# Patient Record
Sex: Female | Born: 1940 | Race: White | Hispanic: No | Smoking: Never smoker
Health system: Southern US, Community
[De-identification: ages and names within clinical notes are randomized; demographics above are authoritative.]

## PROBLEM LIST (undated history)

## (undated) DIAGNOSIS — I10 Essential (primary) hypertension: Secondary | ICD-10-CM

## (undated) DIAGNOSIS — G43909 Migraine, unspecified, not intractable, without status migrainosus: Secondary | ICD-10-CM

## (undated) DIAGNOSIS — M353 Polymyalgia rheumatica: Secondary | ICD-10-CM

## (undated) HISTORY — DX: Migraine, unspecified, not intractable, without status migrainosus: G43.909

## (undated) HISTORY — PX: ABDOMINAL HYSTERECTOMY: SHX81

## (undated) HISTORY — DX: Polymyalgia rheumatica: M35.3

---

## 1999-10-03 ENCOUNTER — Encounter: Payer: Self-pay | Admitting: *Deleted

## 1999-10-03 ENCOUNTER — Encounter: Admission: RE | Admit: 1999-10-03 | Discharge: 1999-10-03 | Payer: Self-pay | Admitting: *Deleted

## 2000-10-05 ENCOUNTER — Encounter: Payer: Self-pay | Admitting: *Deleted

## 2000-10-05 ENCOUNTER — Encounter: Admission: RE | Admit: 2000-10-05 | Discharge: 2000-10-05 | Payer: Self-pay | Admitting: *Deleted

## 2001-10-06 ENCOUNTER — Encounter: Payer: Self-pay | Admitting: Internal Medicine

## 2001-10-06 ENCOUNTER — Encounter: Admission: RE | Admit: 2001-10-06 | Discharge: 2001-10-06 | Payer: Self-pay | Admitting: Internal Medicine

## 2003-06-14 ENCOUNTER — Ambulatory Visit (HOSPITAL_COMMUNITY): Admission: RE | Admit: 2003-06-14 | Discharge: 2003-06-14 | Payer: Self-pay | Admitting: Gastroenterology

## 2011-11-05 DIAGNOSIS — R51 Headache: Secondary | ICD-10-CM | POA: Diagnosis not present

## 2011-11-05 DIAGNOSIS — M353 Polymyalgia rheumatica: Secondary | ICD-10-CM | POA: Diagnosis not present

## 2011-11-05 DIAGNOSIS — M1990 Unspecified osteoarthritis, unspecified site: Secondary | ICD-10-CM | POA: Diagnosis not present

## 2011-11-05 DIAGNOSIS — Z79899 Other long term (current) drug therapy: Secondary | ICD-10-CM | POA: Diagnosis not present

## 2011-11-05 DIAGNOSIS — M899 Disorder of bone, unspecified: Secondary | ICD-10-CM | POA: Diagnosis not present

## 2011-11-05 DIAGNOSIS — M2550 Pain in unspecified joint: Secondary | ICD-10-CM | POA: Diagnosis not present

## 2011-11-05 DIAGNOSIS — G43909 Migraine, unspecified, not intractable, without status migrainosus: Secondary | ICD-10-CM | POA: Diagnosis not present

## 2011-11-27 DIAGNOSIS — R51 Headache: Secondary | ICD-10-CM | POA: Diagnosis not present

## 2011-12-14 DIAGNOSIS — H539 Unspecified visual disturbance: Secondary | ICD-10-CM | POA: Diagnosis not present

## 2011-12-16 DIAGNOSIS — B309 Viral conjunctivitis, unspecified: Secondary | ICD-10-CM | POA: Diagnosis not present

## 2011-12-24 DIAGNOSIS — H04129 Dry eye syndrome of unspecified lacrimal gland: Secondary | ICD-10-CM | POA: Diagnosis not present

## 2011-12-24 DIAGNOSIS — H26109 Unspecified traumatic cataract, unspecified eye: Secondary | ICD-10-CM | POA: Diagnosis not present

## 2011-12-24 DIAGNOSIS — B309 Viral conjunctivitis, unspecified: Secondary | ICD-10-CM | POA: Diagnosis not present

## 2011-12-25 DIAGNOSIS — G43909 Migraine, unspecified, not intractable, without status migrainosus: Secondary | ICD-10-CM | POA: Diagnosis not present

## 2012-02-04 DIAGNOSIS — M899 Disorder of bone, unspecified: Secondary | ICD-10-CM | POA: Diagnosis not present

## 2012-02-04 DIAGNOSIS — IMO0002 Reserved for concepts with insufficient information to code with codable children: Secondary | ICD-10-CM | POA: Diagnosis not present

## 2012-02-04 DIAGNOSIS — G43909 Migraine, unspecified, not intractable, without status migrainosus: Secondary | ICD-10-CM | POA: Diagnosis not present

## 2012-02-04 DIAGNOSIS — M949 Disorder of cartilage, unspecified: Secondary | ICD-10-CM | POA: Diagnosis not present

## 2012-02-04 DIAGNOSIS — M353 Polymyalgia rheumatica: Secondary | ICD-10-CM | POA: Diagnosis not present

## 2012-02-04 DIAGNOSIS — M2550 Pain in unspecified joint: Secondary | ICD-10-CM | POA: Diagnosis not present

## 2012-02-04 DIAGNOSIS — M1990 Unspecified osteoarthritis, unspecified site: Secondary | ICD-10-CM | POA: Diagnosis not present

## 2012-05-05 DIAGNOSIS — G43909 Migraine, unspecified, not intractable, without status migrainosus: Secondary | ICD-10-CM | POA: Diagnosis not present

## 2012-05-05 DIAGNOSIS — M1990 Unspecified osteoarthritis, unspecified site: Secondary | ICD-10-CM | POA: Diagnosis not present

## 2012-05-05 DIAGNOSIS — Z79899 Other long term (current) drug therapy: Secondary | ICD-10-CM | POA: Diagnosis not present

## 2012-05-05 DIAGNOSIS — M545 Low back pain: Secondary | ICD-10-CM | POA: Diagnosis not present

## 2012-05-05 DIAGNOSIS — M353 Polymyalgia rheumatica: Secondary | ICD-10-CM | POA: Diagnosis not present

## 2012-05-05 DIAGNOSIS — M2550 Pain in unspecified joint: Secondary | ICD-10-CM | POA: Diagnosis not present

## 2012-06-13 DIAGNOSIS — Z79899 Other long term (current) drug therapy: Secondary | ICD-10-CM | POA: Diagnosis not present

## 2012-06-13 DIAGNOSIS — K219 Gastro-esophageal reflux disease without esophagitis: Secondary | ICD-10-CM | POA: Diagnosis not present

## 2012-06-13 DIAGNOSIS — Z0000 Encounter for general adult medical examination without abnormal findings: Secondary | ICD-10-CM | POA: Diagnosis not present

## 2012-06-13 DIAGNOSIS — E7800 Pure hypercholesterolemia, unspecified: Secondary | ICD-10-CM | POA: Diagnosis not present

## 2012-06-13 DIAGNOSIS — Z1331 Encounter for screening for depression: Secondary | ICD-10-CM | POA: Diagnosis not present

## 2012-08-05 DIAGNOSIS — M899 Disorder of bone, unspecified: Secondary | ICD-10-CM | POA: Diagnosis not present

## 2012-08-05 DIAGNOSIS — M1990 Unspecified osteoarthritis, unspecified site: Secondary | ICD-10-CM | POA: Diagnosis not present

## 2012-08-05 DIAGNOSIS — M2550 Pain in unspecified joint: Secondary | ICD-10-CM | POA: Diagnosis not present

## 2012-08-05 DIAGNOSIS — Z23 Encounter for immunization: Secondary | ICD-10-CM | POA: Diagnosis not present

## 2012-08-05 DIAGNOSIS — M353 Polymyalgia rheumatica: Secondary | ICD-10-CM | POA: Diagnosis not present

## 2012-08-05 DIAGNOSIS — Z79899 Other long term (current) drug therapy: Secondary | ICD-10-CM | POA: Diagnosis not present

## 2012-08-05 DIAGNOSIS — G43909 Migraine, unspecified, not intractable, without status migrainosus: Secondary | ICD-10-CM | POA: Diagnosis not present

## 2012-11-04 DIAGNOSIS — M949 Disorder of cartilage, unspecified: Secondary | ICD-10-CM | POA: Diagnosis not present

## 2012-11-04 DIAGNOSIS — M1990 Unspecified osteoarthritis, unspecified site: Secondary | ICD-10-CM | POA: Diagnosis not present

## 2012-11-04 DIAGNOSIS — G43909 Migraine, unspecified, not intractable, without status migrainosus: Secondary | ICD-10-CM | POA: Diagnosis not present

## 2012-11-04 DIAGNOSIS — K219 Gastro-esophageal reflux disease without esophagitis: Secondary | ICD-10-CM | POA: Diagnosis not present

## 2012-11-04 DIAGNOSIS — M353 Polymyalgia rheumatica: Secondary | ICD-10-CM | POA: Diagnosis not present

## 2012-11-04 DIAGNOSIS — M899 Disorder of bone, unspecified: Secondary | ICD-10-CM | POA: Diagnosis not present

## 2012-11-04 DIAGNOSIS — IMO0002 Reserved for concepts with insufficient information to code with codable children: Secondary | ICD-10-CM | POA: Diagnosis not present

## 2012-11-04 DIAGNOSIS — M2550 Pain in unspecified joint: Secondary | ICD-10-CM | POA: Diagnosis not present

## 2013-03-03 DIAGNOSIS — M353 Polymyalgia rheumatica: Secondary | ICD-10-CM | POA: Diagnosis not present

## 2013-03-03 DIAGNOSIS — M2550 Pain in unspecified joint: Secondary | ICD-10-CM | POA: Diagnosis not present

## 2013-03-03 DIAGNOSIS — M1990 Unspecified osteoarthritis, unspecified site: Secondary | ICD-10-CM | POA: Diagnosis not present

## 2013-03-03 DIAGNOSIS — IMO0002 Reserved for concepts with insufficient information to code with codable children: Secondary | ICD-10-CM | POA: Diagnosis not present

## 2013-06-02 DIAGNOSIS — M2550 Pain in unspecified joint: Secondary | ICD-10-CM | POA: Diagnosis not present

## 2013-06-02 DIAGNOSIS — M1990 Unspecified osteoarthritis, unspecified site: Secondary | ICD-10-CM | POA: Diagnosis not present

## 2013-06-02 DIAGNOSIS — IMO0002 Reserved for concepts with insufficient information to code with codable children: Secondary | ICD-10-CM | POA: Diagnosis not present

## 2013-06-02 DIAGNOSIS — M353 Polymyalgia rheumatica: Secondary | ICD-10-CM | POA: Diagnosis not present

## 2013-06-15 DIAGNOSIS — Z1331 Encounter for screening for depression: Secondary | ICD-10-CM | POA: Diagnosis not present

## 2013-06-15 DIAGNOSIS — Z86010 Personal history of colonic polyps: Secondary | ICD-10-CM | POA: Diagnosis not present

## 2013-06-15 DIAGNOSIS — M353 Polymyalgia rheumatica: Secondary | ICD-10-CM | POA: Diagnosis not present

## 2013-06-15 DIAGNOSIS — M899 Disorder of bone, unspecified: Secondary | ICD-10-CM | POA: Diagnosis not present

## 2013-06-15 DIAGNOSIS — E7800 Pure hypercholesterolemia, unspecified: Secondary | ICD-10-CM | POA: Diagnosis not present

## 2013-06-15 DIAGNOSIS — Z0000 Encounter for general adult medical examination without abnormal findings: Secondary | ICD-10-CM | POA: Diagnosis not present

## 2013-06-15 DIAGNOSIS — Z79899 Other long term (current) drug therapy: Secondary | ICD-10-CM | POA: Diagnosis not present

## 2013-06-15 DIAGNOSIS — K219 Gastro-esophageal reflux disease without esophagitis: Secondary | ICD-10-CM | POA: Diagnosis not present

## 2013-07-03 DIAGNOSIS — Z1231 Encounter for screening mammogram for malignant neoplasm of breast: Secondary | ICD-10-CM | POA: Diagnosis not present

## 2013-07-12 DIAGNOSIS — M899 Disorder of bone, unspecified: Secondary | ICD-10-CM | POA: Diagnosis not present

## 2013-07-18 DIAGNOSIS — Z23 Encounter for immunization: Secondary | ICD-10-CM | POA: Diagnosis not present

## 2013-08-30 DIAGNOSIS — M1990 Unspecified osteoarthritis, unspecified site: Secondary | ICD-10-CM | POA: Diagnosis not present

## 2013-08-30 DIAGNOSIS — M2550 Pain in unspecified joint: Secondary | ICD-10-CM | POA: Diagnosis not present

## 2013-08-30 DIAGNOSIS — IMO0002 Reserved for concepts with insufficient information to code with codable children: Secondary | ICD-10-CM | POA: Diagnosis not present

## 2013-08-30 DIAGNOSIS — M353 Polymyalgia rheumatica: Secondary | ICD-10-CM | POA: Diagnosis not present

## 2013-11-24 DIAGNOSIS — D485 Neoplasm of uncertain behavior of skin: Secondary | ICD-10-CM | POA: Diagnosis not present

## 2013-11-24 DIAGNOSIS — L570 Actinic keratosis: Secondary | ICD-10-CM | POA: Diagnosis not present

## 2014-01-05 DIAGNOSIS — L570 Actinic keratosis: Secondary | ICD-10-CM | POA: Diagnosis not present

## 2014-03-08 DIAGNOSIS — M353 Polymyalgia rheumatica: Secondary | ICD-10-CM | POA: Diagnosis not present

## 2014-03-08 DIAGNOSIS — M1990 Unspecified osteoarthritis, unspecified site: Secondary | ICD-10-CM | POA: Diagnosis not present

## 2014-03-08 DIAGNOSIS — M2550 Pain in unspecified joint: Secondary | ICD-10-CM | POA: Diagnosis not present

## 2014-03-08 DIAGNOSIS — IMO0002 Reserved for concepts with insufficient information to code with codable children: Secondary | ICD-10-CM | POA: Diagnosis not present

## 2014-03-20 DIAGNOSIS — R42 Dizziness and giddiness: Secondary | ICD-10-CM | POA: Diagnosis not present

## 2014-03-27 ENCOUNTER — Encounter: Payer: Self-pay | Admitting: Neurology

## 2014-03-27 ENCOUNTER — Ambulatory Visit (INDEPENDENT_AMBULATORY_CARE_PROVIDER_SITE_OTHER): Payer: Medicare Other | Admitting: Neurology

## 2014-03-27 VITALS — BP 130/78 | HR 64 | Ht 62.21 in | Wt 136.2 lb

## 2014-03-27 DIAGNOSIS — M353 Polymyalgia rheumatica: Secondary | ICD-10-CM

## 2014-03-27 DIAGNOSIS — R42 Dizziness and giddiness: Secondary | ICD-10-CM

## 2014-03-27 DIAGNOSIS — G43909 Migraine, unspecified, not intractable, without status migrainosus: Secondary | ICD-10-CM

## 2014-03-27 NOTE — Patient Instructions (Signed)
1. We will obtain bloodwork done by your PCP and if additional labs are needed, we will contact you 2. Minimize Tylenol or other over the counter medications to 2-3 a week to avoid rebound headaches 3. Keep a headache calendar

## 2014-03-27 NOTE — Progress Notes (Signed)
NEUROLOGY CONSULTATION NOTE  Madeline Graham MRN: [id number] DOB: 27-Jun-1941  Referring provider: Dr. Nehemiah Settle Primary care provider: Dr. Nehemiah Settle  Reason for consult:  Dizziness, increased headaches  Dear Dr Maxwell Caul:  Thank you for your kind referral of Madeline Graham for consultation of the above symptoms. Although her history is well known to you, please allow me to reiterate it for the purpose of our medical record. Records and images were personally reviewed where available.  HISTORY OF PRESENT ILLNESS: This is a pleasant 73 year old right-handed woman with a history of migraines and polymyalgia rheumatica, in her usual state of health until the past 2 months when she started having an increase in migraine frequency.  She has had migraines since age 45, occurring every 1-3 months, however over the past 2 months, headaches have increased to 1-2 a week.  Headaches are similar to her typical migraines, with throbbing pain in the frontal and occipital region with associated photo and phonophobia, no nausea/vomiting. She has visual auras 15-20 minutes prior to her migraines.  She usually goes to bed and eats a few hours later.  She takes arthritis strength Tylenol and Flexeril which helps.  Triggers include neck pain and flashing lights.  She was having dizziness 2 weeks ago, occurring mid-day when walking outside or looking down, with difficulty focusing.  There was no associated nausea/vomiting, she denied headaches during this time.  The dizziness has resolved.  She has occasional numbness in the left thumb and index finger.  She does report having left hand numbness with her migraines in the past.    She has neck, shoulder, and hip pain with the PMR.  She denies any diplopia, dysarthria, dysphagia, bowel/bladder dysfunction.  She has been on a lower dose of prednisone 71m 5-6 weeks ago.   Laboratory Data: None available, according to patient bloodwork done with PCP  recently.  PAST MEDICAL HISTORY: Past Medical History  Diagnosis Date  . Polymyalgia rheumatica   . Migraine     PAST SURGICAL HISTORY: Past Surgical History  Procedure Laterality Date  . Abdominal hysterectomy      MEDICATIONS: Prednisone 4107mdaily   No current facility-administered medications on file prior to visit.    ALLERGIES: Allergies  Allergen Reactions  . Sulfa Antibiotics     FAMILY HISTORY: Family History  Problem Relation Age of Onset  . Heart Problems Father   . Scleroderma Mother   . Heart attack Father   . Heart Problems Mother     SOCIAL HISTORY: History   Social History  . Marital Status: Widowed    Spouse Name: N/A    Number of Children: 2  . Years of Education: N/A   Occupational History  . retired    Social History Main Topics  . Smoking status: Never Smoker   . Smokeless tobacco: Never Used  . Alcohol Use: No  . Drug Use: No  . Sexual Activity: Not on file   Other Topics Concern  . Not on file   Social History Narrative  . No narrative on file    REVIEW OF SYSTEMS: Constitutional: No fevers, chills, or sweats, no generalized fatigue, change in appetite Eyes: No visual changes, double vision, eye pain Ear, nose and throat: No hearing loss, ear pain, nasal congestion, sore throat Cardiovascular: No chest pain, palpitations Respiratory:  No shortness of breath at rest or with exertion, wheezes GastrointestinaI: No nausea, vomiting, diarrhea, abdominal pain, fecal incontinence Genitourinary:  No dysuria, urinary  retention or frequency Musculoskeletal:  + neck pain, back pain Integumentary: No rash, pruritus, skin lesions Neurological: as above Psychiatric: No depression, insomnia, anxiety Endocrine: No palpitations, fatigue, diaphoresis, mood swings, change in appetite, change in weight, increased thirst Hematologic/Lymphatic:  No anemia, purpura, petechiae. Allergic/Immunologic: no itchy/runny eyes, nasal congestion,  recent allergic reactions, rashes  PHYSICAL EXAM: Filed Vitals:   03/27/14 1245  BP: 130/78  Pulse: 64   General: No acute distress Head:  Normocephalic/atraumatic. +Tenderness to palpation over the left temporal region Eyes: Fundoscopic exam shows bilateral sharp discs, no vessel changes, exudates, or hemorrhages Neck: supple, no paraspinal tenderness, full range of motion Back: No paraspinal tenderness Heart: regular rate and rhythm Lungs: Clear to auscultation bilaterally. Vascular: No carotid bruits. Skin/Extremities: No rash, no edema Neurological Exam: Mental status: alert and oriented to person, place, and time, no dysarthria or aphasia, Fund of knowledge is appropriate.  Recent and remote memory are intact.  Attention and concentration are normal.    Able to name objects and repeat phrases. Cranial nerves: CN I: not tested CN II: pupils equal, round and reactive to light, visual fields intact, fundi unremarkable. CN III, IV, VI:  full range of motion, no nystagmus, no ptosis CN V: facial sensation intact CN VII: upper and lower face symmetric CN VIII: hearing intact to finger rub CN IX, X: gag intact, uvula midline CN XI: sternocleidomastoid and trapezius muscles intact CN XII: tongue midline Bulk & Tone: normal, no fasciculations. Motor: 5/5 throughout with no pronator drift. Sensation: intact to light touch, cold, pin, vibration and joint position sense.  No extinction to double simultaneous stimulation.  Romberg test negative Deep Tendon Reflexes: +2 throughout except for +1 bilateral ankle jerks, no ankle clonus Plantar responses: downgoing bilaterally Cerebellar: no incoordination on finger to nose, heel to shin. No dysdiadochokinesia Gait: narrow-based and steady, able to tandem walk adequately. Tremor: none  IMPRESSION: This is a pleasant 73 year old right-handed woman with a history of polymyalgia rheumatica and migraines, presenting for dizziness and increased  migraine frequency.  The dizziness that occurred for 4-5 days 2 weeks ago has resolved, there is a positional component to her description of the dizziness.  We discussed the increase in migraines, there may be a component of medication overuse, she knows to minimize use of Tylenol to 2-3 a week.  Bloodwork done through her PCP will be requested for review, she has some left temporal tenderness and ESR and CRP will be ordered if not done.  We discussed option for headache prophylaxis, however she would like to hold off on this for now and will keep a headache calendar.  She will follow-up in 3 months and knows to call our office for any problems.  Thank you for allowing me to participate in the care of this patient. Please do not hesitate to call for any questions or concerns.   Madeline Graham, M.D.

## 2014-03-29 ENCOUNTER — Encounter: Payer: Self-pay | Admitting: Neurology

## 2014-03-29 DIAGNOSIS — M353 Polymyalgia rheumatica: Secondary | ICD-10-CM | POA: Insufficient documentation

## 2014-06-25 DIAGNOSIS — Z79899 Other long term (current) drug therapy: Secondary | ICD-10-CM | POA: Diagnosis not present

## 2014-06-25 DIAGNOSIS — G43909 Migraine, unspecified, not intractable, without status migrainosus: Secondary | ICD-10-CM | POA: Diagnosis not present

## 2014-06-25 DIAGNOSIS — Z0000 Encounter for general adult medical examination without abnormal findings: Secondary | ICD-10-CM | POA: Diagnosis not present

## 2014-06-25 DIAGNOSIS — Z1331 Encounter for screening for depression: Secondary | ICD-10-CM | POA: Diagnosis not present

## 2014-06-25 DIAGNOSIS — M899 Disorder of bone, unspecified: Secondary | ICD-10-CM | POA: Diagnosis not present

## 2014-06-29 ENCOUNTER — Encounter: Payer: Self-pay | Admitting: Neurology

## 2014-06-29 ENCOUNTER — Ambulatory Visit (INDEPENDENT_AMBULATORY_CARE_PROVIDER_SITE_OTHER): Payer: Medicare Other | Admitting: Neurology

## 2014-06-29 VITALS — BP 136/74 | HR 67 | Temp 98.5°F | Ht 62.0 in | Wt 134.5 lb

## 2014-06-29 DIAGNOSIS — G43819 Other migraine, intractable, without status migrainosus: Secondary | ICD-10-CM

## 2014-06-29 NOTE — Progress Notes (Signed)
NEUROLOGY FOLLOW UP OFFICE NOTE  Madeline Graham [id number]  HISTORY OF PRESENT ILLNESS: I had the pleasure of seeing Madeline Graham in follow-up in the neurology clinic on 06/29/2014.  The patient was last seen 3 months ago for increased migraines.  We had discussed option for starting headache prophylactic medication however she wanted to hold off and continue monitoring symptoms first.  In the last 3 months, she reports that headaches are a lot milder.  She reports going a month headache-free, which is good for for her.  She usually has mild headaches every few weeks now, some of them affecting her vision where she sees "arches" in her vision, followed 20 minutes later by pain in the back of her head and neck. When she has flares of her PMR, this also affects these regions. She tried reducing prednisone by alternating between 3 and 4mg , and found that this seemed to cause more headaches, so now she is back on 4mg  daily. She denies any further episodes of dizziness.  She feels that stopping frequent use of Tylenol has also helped.  HPI:  This is a pleasant 73 yo RH woman with a history of migraines and polymyalgia rheumatica, in her usual state of health until April 2015 when she started having an increase in migraine frequency. She has had migraines since age 58, occurring every 1-3 months, however headaches had increased to 1-2 a week. Headaches are similar to her typical migraines, with throbbing pain in the frontal and occipital region with associated photo and phonophobia, no nausea/vomiting. She has visual auras 15-20 minutes prior to her migraines. She usually goes to bed and eats a few hours later. She takes arthritis strength Tylenol and Flexeril which helps. Triggers include neck pain and flashing lights. She was having dizziness at that time, which had resolved on her initial visit in June.  She reports having left hand numbness with her migraines in the past.   PAST MEDICAL HISTORY: Past  Medical History  Diagnosis Date  . Polymyalgia rheumatica   . Migraine     MEDICATIONS: Current Outpatient Prescriptions on File Prior to Visit  Medication Sig Dispense Refill  . omeprazole (PRILOSEC) 20 MG capsule Take 20 mg by mouth daily.      . predniSONE (DELTASONE) 1 MG tablet Take 4 mg by mouth daily with breakfast.       No current facility-administered medications on file prior to visit.    ALLERGIES: Allergies  Allergen Reactions  . Sulfa Antibiotics     FAMILY HISTORY: Family History  Problem Relation Age of Onset  . Heart Problems Father   . Scleroderma Mother   . Heart attack Father   . Heart Problems Mother     SOCIAL HISTORY: History   Social History  . Marital Status: Widowed    Spouse Name: N/A    Number of Children: 2  . Years of Education: N/A   Occupational History  . retired    Social History Main Topics  . Smoking status: Never Smoker   . Smokeless tobacco: Never Used  . Alcohol Use: No  . Drug Use: No  . Sexual Activity: Not on file   Other Topics Concern  . Not on file   Social History Narrative  . No narrative on file    REVIEW OF SYSTEMS: Constitutional: No fevers, chills, or sweats, no generalized fatigue, change in appetite Eyes: No visual changes, double vision, eye pain Ear, nose and throat: No hearing loss, ear pain,  nasal congestion, sore throat Cardiovascular: No chest pain, palpitations Respiratory:  No shortness of breath at rest or with exertion, wheezes GastrointestinaI: No nausea, vomiting, diarrhea, abdominal pain, fecal incontinence Genitourinary:  No dysuria, urinary retention or frequency Musculoskeletal:  No neck pain, back pain Integumentary: No rash, pruritus, skin lesions Neurological: as above Psychiatric: No depression, insomnia, anxiety Endocrine: No palpitations, fatigue, diaphoresis, mood swings, change in appetite, change in weight, increased thirst Hematologic/Lymphatic:  No anemia, purpura,  petechiae. Allergic/Immunologic: no itchy/runny eyes, nasal congestion, recent allergic reactions, rashes  PHYSICAL EXAM: Filed Vitals:   06/29/14 1313  BP: 136/74  Pulse: 67  Temp: 98.5 F (36.9 C)   General: No acute distress Head:  Normocephalic/atraumatic Neck: supple, no paraspinal tenderness, full range of motion Heart:  Regular rate and rhythm Lungs:  Clear to auscultation bilaterally Back: No paraspinal tenderness Skin/Extremities: No rash, no edema Neurological Exam: alert and oriented to person, place, and time. No aphasia or dysarthria. Fund of knowledge is appropriate.  Recent and remote memory are intact.  Attention and concentration are normal.    Able to name objects and repeat phrases. Cranial nerves: Pupils equal, round, reactive to light.  Fundoscopic exam unremarkable, no papilledema. Extraocular movements intact with no nystagmus. Visual fields full. Facial sensation intact. No facial asymmetry. Tongue, uvula, palate midline.  Motor: Bulk and tone normal, muscle strength 5/5 throughout with no pronator drift.  Sensation to light touch, temperature and vibration intact.  No extinction to double simultaneous stimulation.  Deep tendon reflexes 2+ throughout except for +1 ankle jerks, toes downgoing.  Finger to nose testing intact.  Gait narrow-based and steady, able to tandem walk adequately.  Romberg negative.  IMPRESSION: This is a pleasant 73 yo RH woman with a history of polymyalgia rheumatica and migraines, who presented for increased migraine frequency. She was having dizziness which had resolved and has not recurred.  We had discussed medication overuse headaches, headaches have improved with reduction in Tylenol intake.  She knows to call our office for increase in migraine frequency, at which point headache prophylaxis will be discussed. She will be seeing her eye doctor soon.  She will follow-up in 6 months and knows to call our office for any problems.  Thank you  for allowing me to participate in her care.  Please do not hesitate to call for any questions or concerns.  The duration of this appointment visit was 15 minutes of face-to-face time with the patient.  Greater than 50% of this time was spent in counseling, explanation of diagnosis, planning of further management, and coordination of care.   Ellouise Newer, M.D.   CC: Dr. Maxwell Caul

## 2014-06-29 NOTE — Patient Instructions (Signed)
1. Continue to monitor headache frequency, keep headache calendar 2. Follow-up in 6 months, or call if any problems in the meantime

## 2014-07-09 DIAGNOSIS — C44319 Basal cell carcinoma of skin of other parts of face: Secondary | ICD-10-CM | POA: Diagnosis not present

## 2014-07-09 DIAGNOSIS — L570 Actinic keratosis: Secondary | ICD-10-CM | POA: Diagnosis not present

## 2014-07-16 DIAGNOSIS — Z1231 Encounter for screening mammogram for malignant neoplasm of breast: Secondary | ICD-10-CM | POA: Diagnosis not present

## 2014-07-18 DIAGNOSIS — Z23 Encounter for immunization: Secondary | ICD-10-CM | POA: Diagnosis not present

## 2014-08-20 ENCOUNTER — Other Ambulatory Visit: Payer: Self-pay | Admitting: Physician Assistant

## 2014-08-20 DIAGNOSIS — Z85828 Personal history of other malignant neoplasm of skin: Secondary | ICD-10-CM | POA: Diagnosis not present

## 2014-08-20 DIAGNOSIS — C4441 Basal cell carcinoma of skin of scalp and neck: Secondary | ICD-10-CM | POA: Diagnosis not present

## 2014-08-20 DIAGNOSIS — Z08 Encounter for follow-up examination after completed treatment for malignant neoplasm: Secondary | ICD-10-CM | POA: Diagnosis not present

## 2014-08-30 DIAGNOSIS — C4441 Basal cell carcinoma of skin of scalp and neck: Secondary | ICD-10-CM | POA: Diagnosis not present

## 2014-09-06 DIAGNOSIS — M2550 Pain in unspecified joint: Secondary | ICD-10-CM | POA: Diagnosis not present

## 2014-09-06 DIAGNOSIS — M159 Polyosteoarthritis, unspecified: Secondary | ICD-10-CM | POA: Diagnosis not present

## 2014-09-06 DIAGNOSIS — M353 Polymyalgia rheumatica: Secondary | ICD-10-CM | POA: Diagnosis not present

## 2014-09-06 DIAGNOSIS — M8580 Other specified disorders of bone density and structure, unspecified site: Secondary | ICD-10-CM | POA: Diagnosis not present

## 2014-10-25 DIAGNOSIS — Z08 Encounter for follow-up examination after completed treatment for malignant neoplasm: Secondary | ICD-10-CM | POA: Diagnosis not present

## 2014-10-25 DIAGNOSIS — Z85828 Personal history of other malignant neoplasm of skin: Secondary | ICD-10-CM | POA: Diagnosis not present

## 2014-12-06 ENCOUNTER — Telehealth: Payer: Self-pay | Admitting: *Deleted

## 2014-12-06 NOTE — Telephone Encounter (Signed)
Patient canceled follow up appointment and will call to reschedule at a later date

## 2014-12-28 ENCOUNTER — Ambulatory Visit: Payer: Medicare Other | Admitting: Neurology

## 2015-01-03 DIAGNOSIS — M353 Polymyalgia rheumatica: Secondary | ICD-10-CM | POA: Diagnosis not present

## 2015-01-03 DIAGNOSIS — M150 Primary generalized (osteo)arthritis: Secondary | ICD-10-CM | POA: Diagnosis not present

## 2015-01-03 DIAGNOSIS — Z7952 Long term (current) use of systemic steroids: Secondary | ICD-10-CM | POA: Diagnosis not present

## 2015-01-03 DIAGNOSIS — M2550 Pain in unspecified joint: Secondary | ICD-10-CM | POA: Diagnosis not present

## 2015-01-24 DIAGNOSIS — Z85828 Personal history of other malignant neoplasm of skin: Secondary | ICD-10-CM | POA: Diagnosis not present

## 2015-01-24 DIAGNOSIS — Z08 Encounter for follow-up examination after completed treatment for malignant neoplasm: Secondary | ICD-10-CM | POA: Diagnosis not present

## 2015-01-24 DIAGNOSIS — L814 Other melanin hyperpigmentation: Secondary | ICD-10-CM | POA: Diagnosis not present

## 2015-01-24 DIAGNOSIS — D225 Melanocytic nevi of trunk: Secondary | ICD-10-CM | POA: Diagnosis not present

## 2015-04-04 DIAGNOSIS — M2550 Pain in unspecified joint: Secondary | ICD-10-CM | POA: Diagnosis not present

## 2015-04-04 DIAGNOSIS — M150 Primary generalized (osteo)arthritis: Secondary | ICD-10-CM | POA: Diagnosis not present

## 2015-04-04 DIAGNOSIS — Z7952 Long term (current) use of systemic steroids: Secondary | ICD-10-CM | POA: Diagnosis not present

## 2015-04-04 DIAGNOSIS — M353 Polymyalgia rheumatica: Secondary | ICD-10-CM | POA: Diagnosis not present

## 2015-07-11 DIAGNOSIS — Z0000 Encounter for general adult medical examination without abnormal findings: Secondary | ICD-10-CM | POA: Diagnosis not present

## 2015-07-11 DIAGNOSIS — M353 Polymyalgia rheumatica: Secondary | ICD-10-CM | POA: Diagnosis not present

## 2015-07-11 DIAGNOSIS — E785 Hyperlipidemia, unspecified: Secondary | ICD-10-CM | POA: Diagnosis not present

## 2015-07-11 DIAGNOSIS — Z1389 Encounter for screening for other disorder: Secondary | ICD-10-CM | POA: Diagnosis not present

## 2015-07-11 DIAGNOSIS — G43109 Migraine with aura, not intractable, without status migrainosus: Secondary | ICD-10-CM | POA: Diagnosis not present

## 2015-07-11 DIAGNOSIS — Z1211 Encounter for screening for malignant neoplasm of colon: Secondary | ICD-10-CM | POA: Diagnosis not present

## 2015-07-11 DIAGNOSIS — Z23 Encounter for immunization: Secondary | ICD-10-CM | POA: Diagnosis not present

## 2015-07-18 DIAGNOSIS — Z1211 Encounter for screening for malignant neoplasm of colon: Secondary | ICD-10-CM | POA: Diagnosis not present

## 2015-07-23 DIAGNOSIS — Z1231 Encounter for screening mammogram for malignant neoplasm of breast: Secondary | ICD-10-CM | POA: Diagnosis not present

## 2015-07-25 ENCOUNTER — Other Ambulatory Visit: Payer: Self-pay | Admitting: Physician Assistant

## 2015-07-25 DIAGNOSIS — C4441 Basal cell carcinoma of skin of scalp and neck: Secondary | ICD-10-CM | POA: Diagnosis not present

## 2015-07-25 DIAGNOSIS — L820 Inflamed seborrheic keratosis: Secondary | ICD-10-CM | POA: Diagnosis not present

## 2015-08-15 DIAGNOSIS — Z23 Encounter for immunization: Secondary | ICD-10-CM | POA: Diagnosis not present

## 2015-08-23 DIAGNOSIS — M2550 Pain in unspecified joint: Secondary | ICD-10-CM | POA: Diagnosis not present

## 2015-08-23 DIAGNOSIS — Z7952 Long term (current) use of systemic steroids: Secondary | ICD-10-CM | POA: Diagnosis not present

## 2015-08-23 DIAGNOSIS — M353 Polymyalgia rheumatica: Secondary | ICD-10-CM | POA: Diagnosis not present

## 2015-08-23 DIAGNOSIS — M150 Primary generalized (osteo)arthritis: Secondary | ICD-10-CM | POA: Diagnosis not present

## 2015-09-06 DIAGNOSIS — C4441 Basal cell carcinoma of skin of scalp and neck: Secondary | ICD-10-CM | POA: Diagnosis not present

## 2015-09-30 ENCOUNTER — Telehealth: Payer: Self-pay | Admitting: Neurology

## 2015-09-30 NOTE — Telephone Encounter (Signed)
Returned call to patient. She states that last week she had a bought with a migraine. She states she's no longer having pain, but she feels like her balance if off and feels like at times her vision is out of focus. I did tell her that she needed to be seen as she hasn't been seen in over a year. I also advised her in the meantime to call her pcp and eye doctor about above sxs, I did give her an appt for 1/24 with Dr. Delice Lesch will also have her put on the cancellation list for possible earlier appt. Patient verbalized good understanding she states she will call her pcp.

## 2015-09-30 NOTE — Telephone Encounter (Signed)
Pt called and is having really bad headaches and is feeling dizzy/Dawn CB# [phone]

## 2015-11-19 ENCOUNTER — Ambulatory Visit (INDEPENDENT_AMBULATORY_CARE_PROVIDER_SITE_OTHER): Payer: Medicare Other | Admitting: Neurology

## 2015-11-19 ENCOUNTER — Encounter: Payer: Self-pay | Admitting: Neurology

## 2015-11-19 VITALS — BP 120/80 | HR 66 | Ht 62.0 in | Wt 133.2 lb

## 2015-11-19 DIAGNOSIS — G43109 Migraine with aura, not intractable, without status migrainosus: Secondary | ICD-10-CM

## 2015-11-19 NOTE — Progress Notes (Signed)
NEUROLOGY FOLLOW UP OFFICE NOTE  ONYINYECHI CLAPSADDLE [id number]  HISTORY OF PRESENT ILLNESS: I had the pleasure of seeing Madeline Graham in the neurology clinic on 11/19/2015.  The patient was last seen in September 2015 for increased migraines. At that time she reported doing better with reducing Tylenol intake and opted to hold off on headache preventative medications. 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 She had another one 2-3 days later, and then a week later again. These migraines were different, she was in pain where she could not talk and felt she could not control her eyes to focus together. She took Flexeril and went to bed. Headaches have quieted down since mid-December, with only one bad migraine 2 weeks ago. She felt her balance was off, but this has gotten better in the past 2 weeks. She denies any nausea, vomiting, but did have the arches in her vision. No focal numbness/tingling/weakness, no falls. She has milder 2/10 headaches every 6 weeks or so and takes Flexeril.   HPI:  This is a pleasant 75 yo RH woman with a history of migraines and polymyalgia rheumatica, in her usual state of health until April 2015 when she started having an increase in migraine frequency. She has had migraines since age 56, occurring every 1-3 months, however headaches had increased to 1-2 a week. Headaches are similar to her typical migraines, with throbbing pain in the frontal and occipital region with associated photo and phonophobia, no nausea/vomiting. She has visual auras 15-20 minutes prior to her migraines. She usually goes to bed and eats a few hours later. She takes arthritis strength Tylenol and Flexeril which helps. Triggers include neck pain and flashing lights. She was having dizziness at that time, which had resolved on her  initial visit in June.  She reports having left hand numbness with her migraines in the past.   PAST MEDICAL HISTORY: Past Medical History  Diagnosis Date  . Polymyalgia rheumatica ([place])   . Migraine     MEDICATIONS: Current Outpatient Prescriptions on File Prior to Visit  Medication Sig Dispense Refill  . omeprazole (PRILOSEC) 20 MG capsule Take 20 mg by mouth daily.    . predniSONE (DELTASONE) 1 MG tablet Take 4 mg by mouth daily with breakfast.     No current facility-administered medications on file prior to visit.    ALLERGIES: Allergies  Allergen Reactions  . Sulfa Antibiotics     FAMILY HISTORY: Family History  Problem Relation Age of Onset  . Heart Problems Father   . Scleroderma Mother   . Heart attack Father   . Heart Problems Mother     SOCIAL HISTORY: Social History   Social History  . Marital Status: Widowed    Spouse Name: N/A  . Number of Children: 2  . Years of Education: N/A   Occupational History  . retired    Social History Main Topics  . Smoking status: Never Smoker   . Smokeless tobacco: Never Used  . Alcohol Use: No  . Drug Use: No  . Sexual Activity: Not on file   Other Topics Concern  . Not on file   Social History Narrative    REVIEW OF SYSTEMS: Constitutional: No fevers, chills, or sweats, no generalized fatigue, change in appetite Eyes: No visual changes, double vision, eye pain Ear,  nose and throat: No hearing loss, ear pain, nasal congestion, sore throat Cardiovascular: No chest pain, palpitations Respiratory:  No shortness of breath at rest or with exertion, wheezes GastrointestinaI: No nausea, vomiting, diarrhea, abdominal pain, fecal incontinence Genitourinary:  No dysuria, urinary retention or frequency Musculoskeletal:  No neck pain, back pain Integumentary: No rash, pruritus, skin lesions Neurological: as above Psychiatric: No depression, insomnia, anxiety Endocrine: No palpitations, fatigue, diaphoresis, mood  swings, change in appetite, change in weight, increased thirst Hematologic/Lymphatic:  No anemia, purpura, petechiae. Allergic/Immunologic: no itchy/runny eyes, nasal congestion, recent allergic reactions, rashes  PHYSICAL EXAM: Filed Vitals:   11/19/15 1111  BP: 120/80  Pulse: 66   General: No acute distress Head:  Normocephalic/atraumatic, no tenderness to palpation Neck: supple, no paraspinal tenderness, full range of motion Heart:  Regular rate and rhythm Lungs:  Clear to auscultation bilaterally Back: No paraspinal tenderness Skin/Extremities: No rash, no edema Neurological Exam: alert and oriented to person, place, and time. No aphasia or dysarthria. Fund of knowledge is appropriate.  Recent and remote memory are intact.  Attention and concentration are normal.    Able to name objects and repeat phrases. Cranial nerves: Pupils equal, round, reactive to light.  Fundoscopic exam unremarkable, no papilledema. Extraocular movements intact with no nystagmus. Visual fields full. Facial sensation intact. No facial asymmetry. Tongue, uvula, palate midline.  Motor: Bulk and tone normal, muscle strength 5/5 throughout with no pronator drift.  Sensation to light touch, temperature and vibration intact.  No extinction to double simultaneous stimulation.  Deep tendon reflexes 2+ throughout except for +1 ankle jerks, toes downgoing.  Finger to nose testing intact.  Gait narrow-based and steady, able to tandem walk adequately.  Romberg negative.  IMPRESSION: This is a pleasant 75 yo RH woman with a history of polymyalgia rheumatica and migraines, who presented more than a year ago for increased migraine frequency and dizziness. She had been doing very well until she had basal cell excisional biopsy last November, which caused scalp pain and triggered migraines. She has been doing much better, with only one migraine 2 weeks ago. She takes Flexeril with good effect and would like to continue with prn  medication. She is not interested in headache preventative medication at this time. She will Graham in 8-9 months and knows to call for any problems.  Thank you for allowing me to participate in her care.  Please do not hesitate to call for any questions or concerns.  The duration of this appointment visit was 25 minutes of face-to-face time with the patient.  Greater than 50% of this time was spent in counseling, explanation of diagnosis, planning of further management, and coordination of care.   Madeline Graham, M.D.   CC: Dr. Maxwell Caul

## 2015-11-19 NOTE — Patient Instructions (Signed)
1. Take Flexeril and Tylenol or Excedrin migraine as needed at onset of migraine. Do not take more than 2-3 a week to avoid rebound headaches 2. Follow-up in 8-9 months, call for any changes

## 2015-11-23 DIAGNOSIS — G43109 Migraine with aura, not intractable, without status migrainosus: Secondary | ICD-10-CM | POA: Insufficient documentation

## 2015-11-26 DIAGNOSIS — M353 Polymyalgia rheumatica: Secondary | ICD-10-CM | POA: Diagnosis not present

## 2015-11-26 DIAGNOSIS — M2550 Pain in unspecified joint: Secondary | ICD-10-CM | POA: Diagnosis not present

## 2015-11-26 DIAGNOSIS — Z7952 Long term (current) use of systemic steroids: Secondary | ICD-10-CM | POA: Diagnosis not present

## 2015-11-26 DIAGNOSIS — M150 Primary generalized (osteo)arthritis: Secondary | ICD-10-CM | POA: Diagnosis not present

## 2016-01-21 DIAGNOSIS — H2512 Age-related nuclear cataract, left eye: Secondary | ICD-10-CM | POA: Diagnosis not present

## 2016-01-27 DIAGNOSIS — R42 Dizziness and giddiness: Secondary | ICD-10-CM | POA: Diagnosis not present

## 2016-01-27 DIAGNOSIS — R030 Elevated blood-pressure reading, without diagnosis of hypertension: Secondary | ICD-10-CM | POA: Diagnosis not present

## 2016-02-03 DIAGNOSIS — H25812 Combined forms of age-related cataract, left eye: Secondary | ICD-10-CM | POA: Diagnosis not present

## 2016-02-03 DIAGNOSIS — H578 Other specified disorders of eye and adnexa: Secondary | ICD-10-CM | POA: Diagnosis not present

## 2016-02-03 DIAGNOSIS — Z961 Presence of intraocular lens: Secondary | ICD-10-CM | POA: Diagnosis not present

## 2016-02-03 DIAGNOSIS — H2512 Age-related nuclear cataract, left eye: Secondary | ICD-10-CM | POA: Diagnosis not present

## 2016-02-06 DIAGNOSIS — I10 Essential (primary) hypertension: Secondary | ICD-10-CM | POA: Diagnosis not present

## 2016-02-12 DIAGNOSIS — I10 Essential (primary) hypertension: Secondary | ICD-10-CM | POA: Diagnosis not present

## 2016-02-14 DIAGNOSIS — E871 Hypo-osmolality and hyponatremia: Secondary | ICD-10-CM | POA: Diagnosis not present

## 2016-02-17 DIAGNOSIS — H25811 Combined forms of age-related cataract, right eye: Secondary | ICD-10-CM | POA: Diagnosis not present

## 2016-02-17 DIAGNOSIS — H2511 Age-related nuclear cataract, right eye: Secondary | ICD-10-CM | POA: Diagnosis not present

## 2016-02-20 DIAGNOSIS — M150 Primary generalized (osteo)arthritis: Secondary | ICD-10-CM | POA: Diagnosis not present

## 2016-02-20 DIAGNOSIS — R51 Headache: Secondary | ICD-10-CM | POA: Diagnosis not present

## 2016-02-20 DIAGNOSIS — M2550 Pain in unspecified joint: Secondary | ICD-10-CM | POA: Diagnosis not present

## 2016-02-20 DIAGNOSIS — M353 Polymyalgia rheumatica: Secondary | ICD-10-CM | POA: Diagnosis not present

## 2016-02-20 DIAGNOSIS — Z7952 Long term (current) use of systemic steroids: Secondary | ICD-10-CM | POA: Diagnosis not present

## 2016-03-26 ENCOUNTER — Encounter: Payer: Self-pay | Admitting: Family Medicine

## 2016-04-29 DIAGNOSIS — H59032 Cystoid macular edema following cataract surgery, left eye: Secondary | ICD-10-CM | POA: Diagnosis not present

## 2016-05-21 DIAGNOSIS — K219 Gastro-esophageal reflux disease without esophagitis: Secondary | ICD-10-CM | POA: Diagnosis not present

## 2016-05-21 DIAGNOSIS — M150 Primary generalized (osteo)arthritis: Secondary | ICD-10-CM | POA: Diagnosis not present

## 2016-05-21 DIAGNOSIS — M353 Polymyalgia rheumatica: Secondary | ICD-10-CM | POA: Diagnosis not present

## 2016-05-21 DIAGNOSIS — Z7952 Long term (current) use of systemic steroids: Secondary | ICD-10-CM | POA: Diagnosis not present

## 2016-06-02 DIAGNOSIS — R21 Rash and other nonspecific skin eruption: Secondary | ICD-10-CM | POA: Diagnosis not present

## 2016-06-02 DIAGNOSIS — Z886 Allergy status to analgesic agent status: Secondary | ICD-10-CM | POA: Diagnosis not present

## 2016-06-02 DIAGNOSIS — W57XXXA Bitten or stung by nonvenomous insect and other nonvenomous arthropods, initial encounter: Secondary | ICD-10-CM | POA: Diagnosis not present

## 2016-06-11 DIAGNOSIS — H35351 Cystoid macular degeneration, right eye: Secondary | ICD-10-CM | POA: Diagnosis not present

## 2016-06-22 DIAGNOSIS — M1990 Unspecified osteoarthritis, unspecified site: Secondary | ICD-10-CM | POA: Diagnosis not present

## 2016-06-22 DIAGNOSIS — L509 Urticaria, unspecified: Secondary | ICD-10-CM | POA: Diagnosis not present

## 2016-06-22 DIAGNOSIS — M353 Polymyalgia rheumatica: Secondary | ICD-10-CM | POA: Diagnosis not present

## 2016-06-22 DIAGNOSIS — G43909 Migraine, unspecified, not intractable, without status migrainosus: Secondary | ICD-10-CM | POA: Diagnosis not present

## 2016-06-22 DIAGNOSIS — I10 Essential (primary) hypertension: Secondary | ICD-10-CM | POA: Diagnosis not present

## 2016-07-09 DIAGNOSIS — H35352 Cystoid macular degeneration, left eye: Secondary | ICD-10-CM | POA: Diagnosis not present

## 2016-07-21 ENCOUNTER — Ambulatory Visit: Payer: No Typology Code available for payment source | Admitting: Neurology

## 2016-07-28 ENCOUNTER — Ambulatory Visit: Payer: No Typology Code available for payment source | Admitting: Neurology

## 2016-08-05 DIAGNOSIS — M899 Disorder of bone, unspecified: Secondary | ICD-10-CM | POA: Diagnosis not present

## 2016-08-05 DIAGNOSIS — Z23 Encounter for immunization: Secondary | ICD-10-CM | POA: Diagnosis not present

## 2016-08-05 DIAGNOSIS — Z86010 Personal history of colonic polyps: Secondary | ICD-10-CM | POA: Diagnosis not present

## 2016-08-05 DIAGNOSIS — K219 Gastro-esophageal reflux disease without esophagitis: Secondary | ICD-10-CM | POA: Diagnosis not present

## 2016-08-05 DIAGNOSIS — Z780 Asymptomatic menopausal state: Secondary | ICD-10-CM | POA: Diagnosis not present

## 2016-08-05 DIAGNOSIS — I10 Essential (primary) hypertension: Secondary | ICD-10-CM | POA: Diagnosis not present

## 2016-08-05 DIAGNOSIS — G43909 Migraine, unspecified, not intractable, without status migrainosus: Secondary | ICD-10-CM | POA: Diagnosis not present

## 2016-08-05 DIAGNOSIS — E782 Mixed hyperlipidemia: Secondary | ICD-10-CM | POA: Diagnosis not present

## 2016-08-05 DIAGNOSIS — Z1389 Encounter for screening for other disorder: Secondary | ICD-10-CM | POA: Diagnosis not present

## 2016-08-05 DIAGNOSIS — M353 Polymyalgia rheumatica: Secondary | ICD-10-CM | POA: Diagnosis not present

## 2016-08-05 DIAGNOSIS — Z0000 Encounter for general adult medical examination without abnormal findings: Secondary | ICD-10-CM | POA: Diagnosis not present

## 2016-08-26 DIAGNOSIS — Z803 Family history of malignant neoplasm of breast: Secondary | ICD-10-CM | POA: Diagnosis not present

## 2016-08-26 DIAGNOSIS — Z1231 Encounter for screening mammogram for malignant neoplasm of breast: Secondary | ICD-10-CM | POA: Diagnosis not present

## 2016-09-21 DIAGNOSIS — M353 Polymyalgia rheumatica: Secondary | ICD-10-CM | POA: Diagnosis not present

## 2016-09-21 DIAGNOSIS — K219 Gastro-esophageal reflux disease without esophagitis: Secondary | ICD-10-CM | POA: Diagnosis not present

## 2016-09-21 DIAGNOSIS — M150 Primary generalized (osteo)arthritis: Secondary | ICD-10-CM | POA: Diagnosis not present

## 2016-09-21 DIAGNOSIS — M810 Age-related osteoporosis without current pathological fracture: Secondary | ICD-10-CM | POA: Diagnosis not present

## 2016-09-21 DIAGNOSIS — E559 Vitamin D deficiency, unspecified: Secondary | ICD-10-CM | POA: Diagnosis not present

## 2016-09-21 DIAGNOSIS — Z7952 Long term (current) use of systemic steroids: Secondary | ICD-10-CM | POA: Diagnosis not present

## 2016-09-24 DIAGNOSIS — H35352 Cystoid macular degeneration, left eye: Secondary | ICD-10-CM | POA: Diagnosis not present

## 2016-11-09 DIAGNOSIS — Z85828 Personal history of other malignant neoplasm of skin: Secondary | ICD-10-CM | POA: Diagnosis not present

## 2016-11-09 DIAGNOSIS — D225 Melanocytic nevi of trunk: Secondary | ICD-10-CM | POA: Diagnosis not present

## 2016-11-09 DIAGNOSIS — L570 Actinic keratosis: Secondary | ICD-10-CM | POA: Diagnosis not present

## 2016-11-09 DIAGNOSIS — L309 Dermatitis, unspecified: Secondary | ICD-10-CM | POA: Diagnosis not present

## 2016-11-09 DIAGNOSIS — L814 Other melanin hyperpigmentation: Secondary | ICD-10-CM | POA: Diagnosis not present

## 2016-11-09 DIAGNOSIS — L821 Other seborrheic keratosis: Secondary | ICD-10-CM | POA: Diagnosis not present

## 2016-12-21 DIAGNOSIS — L570 Actinic keratosis: Secondary | ICD-10-CM | POA: Diagnosis not present

## 2017-01-19 DIAGNOSIS — M353 Polymyalgia rheumatica: Secondary | ICD-10-CM | POA: Diagnosis not present

## 2017-01-19 DIAGNOSIS — M25559 Pain in unspecified hip: Secondary | ICD-10-CM | POA: Diagnosis not present

## 2017-01-19 DIAGNOSIS — M150 Primary generalized (osteo)arthritis: Secondary | ICD-10-CM | POA: Diagnosis not present

## 2017-01-19 DIAGNOSIS — M160 Bilateral primary osteoarthritis of hip: Secondary | ICD-10-CM | POA: Diagnosis not present

## 2017-01-19 DIAGNOSIS — M25551 Pain in right hip: Secondary | ICD-10-CM | POA: Diagnosis not present

## 2017-01-19 DIAGNOSIS — Z7952 Long term (current) use of systemic steroids: Secondary | ICD-10-CM | POA: Diagnosis not present

## 2017-01-26 ENCOUNTER — Emergency Department (HOSPITAL_COMMUNITY): Payer: Medicare Other

## 2017-01-26 ENCOUNTER — Emergency Department (HOSPITAL_COMMUNITY)
Admission: EM | Admit: 2017-01-26 | Discharge: 2017-01-26 | Disposition: A | Discharge status: Home / Self Care | Payer: Medicare Other | Attending: Emergency Medicine | Admitting: Emergency Medicine

## 2017-01-26 ENCOUNTER — Encounter (HOSPITAL_COMMUNITY): Payer: Self-pay

## 2017-01-26 DIAGNOSIS — G43109 Migraine with aura, not intractable, without status migrainosus: Secondary | ICD-10-CM

## 2017-01-26 DIAGNOSIS — E871 Hypo-osmolality and hyponatremia: Secondary | ICD-10-CM | POA: Insufficient documentation

## 2017-01-26 DIAGNOSIS — Z79899 Other long term (current) drug therapy: Secondary | ICD-10-CM | POA: Insufficient documentation

## 2017-01-26 DIAGNOSIS — G43809 Other migraine, not intractable, without status migrainosus: Secondary | ICD-10-CM | POA: Diagnosis not present

## 2017-01-26 DIAGNOSIS — I10 Essential (primary) hypertension: Secondary | ICD-10-CM | POA: Diagnosis not present

## 2017-01-26 DIAGNOSIS — R531 Weakness: Secondary | ICD-10-CM | POA: Diagnosis not present

## 2017-01-26 DIAGNOSIS — E876 Hypokalemia: Secondary | ICD-10-CM | POA: Diagnosis not present

## 2017-01-26 DIAGNOSIS — Z7982 Long term (current) use of aspirin: Secondary | ICD-10-CM | POA: Insufficient documentation

## 2017-01-26 DIAGNOSIS — R404 Transient alteration of awareness: Secondary | ICD-10-CM | POA: Diagnosis not present

## 2017-01-26 DIAGNOSIS — R51 Headache: Secondary | ICD-10-CM | POA: Diagnosis not present

## 2017-01-26 HISTORY — DX: Essential (primary) hypertension: I10

## 2017-01-26 LAB — CBC WITH DIFFERENTIAL/PLATELET
Basophils Absolute: 0 10*3/uL (ref 0.0–0.1)
Basophils Relative: 0 %
EOS PCT: 0 %
Eosinophils Absolute: 0 10*3/uL (ref 0.0–0.7)
HCT: 43.9 % (ref 36.0–46.0)
HEMOGLOBIN: 14.8 g/dL (ref 12.0–15.0)
LYMPHS ABS: 2.9 10*3/uL (ref 0.7–4.0)
LYMPHS PCT: 27 %
MCH: 30 pg (ref 26.0–34.0)
MCHC: 33.7 g/dL (ref 30.0–36.0)
MCV: 88.9 fL (ref 78.0–100.0)
Monocytes Absolute: 0.9 10*3/uL (ref 0.1–1.0)
Monocytes Relative: 9 %
NEUTROS ABS: 6.8 10*3/uL (ref 1.7–7.7)
NEUTROS PCT: 64 %
PLATELETS: 379 10*3/uL (ref 150–400)
RBC: 4.94 MIL/uL (ref 3.87–5.11)
RDW: 12.9 % (ref 11.5–15.5)
WBC: 10.7 10*3/uL — AB (ref 4.0–10.5)

## 2017-01-26 LAB — URINALYSIS, ROUTINE W REFLEX MICROSCOPIC
Bilirubin Urine: NEGATIVE
GLUCOSE, UA: NEGATIVE mg/dL
Ketones, ur: NEGATIVE mg/dL
Nitrite: NEGATIVE
PH: 7 (ref 5.0–8.0)
Protein, ur: NEGATIVE mg/dL
Specific Gravity, Urine: 1.016 (ref 1.005–1.030)

## 2017-01-26 LAB — PROTIME-INR
INR: 0.96
PROTHROMBIN TIME: 12.8 s (ref 11.4–15.2)

## 2017-01-26 LAB — I-STAT TROPONIN, ED: TROPONIN I, POC: 0 ng/mL (ref 0.00–0.08)

## 2017-01-26 LAB — COMPREHENSIVE METABOLIC PANEL
ALT: 15 U/L (ref 14–54)
ANION GAP: 12 (ref 5–15)
AST: 24 U/L (ref 15–41)
Albumin: 4 g/dL (ref 3.5–5.0)
Alkaline Phosphatase: 67 U/L (ref 38–126)
BILIRUBIN TOTAL: 0.8 mg/dL (ref 0.3–1.2)
BUN: 9 mg/dL (ref 6–20)
CHLORIDE: 92 mmol/L — AB (ref 101–111)
CO2: 28 mmol/L (ref 22–32)
Calcium: 9.6 mg/dL (ref 8.9–10.3)
Creatinine, Ser: 0.79 mg/dL (ref 0.44–1.00)
Glucose, Bld: 122 mg/dL — ABNORMAL HIGH (ref 65–99)
POTASSIUM: 3 mmol/L — AB (ref 3.5–5.1)
Sodium: 132 mmol/L — ABNORMAL LOW (ref 135–145)
TOTAL PROTEIN: 7.4 g/dL (ref 6.5–8.1)

## 2017-01-26 LAB — LIPASE, BLOOD: Lipase: 20 U/L (ref 11–51)

## 2017-01-26 MED ORDER — POTASSIUM CHLORIDE CRYS ER 20 MEQ PO TBCR
40.0000 meq | EXTENDED_RELEASE_TABLET | Freq: Once | ORAL | Status: AC
  Administered 2017-01-26: 40 meq via ORAL
  Filled 2017-01-26: qty 2

## 2017-01-26 MED ORDER — SODIUM CHLORIDE 0.9 % IV SOLN
Freq: Once | INTRAVENOUS | Status: AC
  Administered 2017-01-26: 12:00:00 via INTRAVENOUS

## 2017-01-26 MED ORDER — SODIUM CHLORIDE 0.9 % IV BOLUS (SEPSIS)
500.0000 mL | Freq: Once | INTRAVENOUS | Status: AC
  Administered 2017-01-26: 500 mL via INTRAVENOUS

## 2017-01-26 MED ORDER — POTASSIUM CHLORIDE CRYS ER 20 MEQ PO TBCR: 20.0000 meq | EXTENDED_RELEASE_TABLET | Freq: Two times a day (BID) | ORAL | 0 refills | Status: DC

## 2017-01-26 MED ORDER — METOCLOPRAMIDE HCL 10 MG PO TABS: 10.0000 mg | ORAL_TABLET | Freq: Four times a day (QID) | ORAL | 0 refills | Status: DC

## 2017-01-26 MED ORDER — DIPHENHYDRAMINE HCL 50 MG/ML IJ SOLN
25.0000 mg | Freq: Once | INTRAMUSCULAR | Status: AC
  Administered 2017-01-26: 25 mg via INTRAVENOUS
  Filled 2017-01-26: qty 1

## 2017-01-26 MED ORDER — DEXAMETHASONE SODIUM PHOSPHATE 10 MG/ML IJ SOLN
10.0000 mg | Freq: Once | INTRAMUSCULAR | Status: AC
  Administered 2017-01-26: 10 mg via INTRAVENOUS
  Filled 2017-01-26: qty 1

## 2017-01-26 MED ORDER — METOCLOPRAMIDE HCL 5 MG/ML IJ SOLN
10.0000 mg | Freq: Once | INTRAMUSCULAR | Status: AC
  Administered 2017-01-26: 10 mg via INTRAVENOUS
  Filled 2017-01-26: qty 2

## 2017-01-26 MED ORDER — SODIUM CHLORIDE 0.9 % IV SOLN
30.0000 meq | Freq: Once | INTRAVENOUS | Status: AC
  Administered 2017-01-26: 30 meq via INTRAVENOUS
  Filled 2017-01-26: qty 15

## 2017-01-26 MED ORDER — DIPHENHYDRAMINE HCL 25 MG PO CAPS: 25.0000 mg | ORAL_CAPSULE | Freq: Four times a day (QID) | ORAL | 0 refills | Status: DC | PRN

## 2017-01-26 MED ORDER — SODIUM CHLORIDE 0.9 % IV BOLUS (SEPSIS): 500.0000 mL | Freq: Once | INTRAVENOUS | Status: DC

## 2017-01-26 NOTE — ED Notes (Signed)
Family requesting update  Wants to know why patient had to go to MRI after the CT Dr. Vallery Ridge Notified to go update family

## 2017-01-26 NOTE — ED Triage Notes (Signed)
Felt dizzy and weak when standing this AM. Sat herself down, no fall. Still feels weak and dizzy with a headache with some associated nausea and vomiting. Vitals WDL

## 2017-01-26 NOTE — ED Notes (Signed)
We attempted to collect urine sample. Patient was helped to the restroom but sample was contaminated. Patient still nauseous and dry heaving. Reglan given. Will reassess shortly. Lights turned off and cold compress given to patient

## 2017-01-26 NOTE — ED Provider Notes (Signed)
[facility] DEPT Provider Note   CSN: [id number] Arrival date & time: 01/26/17  0906     History   Chief Complaint Chief Complaint  Patient presents with  . Weakness    HPI Madeline Graham is a 76 y.o. female.  HPI Patient has a history of migraine and PMR. She reports yesterday early morning about 4 AM she awakened with a headache in her posterior head. She reports she was worried it would turn into a migraine and it did get much worse. She reports she experienced nausea but did not have vomiting yesterday. She reports she got some "arcing" in her vision and that helped her diagnosis of a migraine. She rested on the couch most of the day and tried taking extra strength OTC arthritis medicine. This did not help much. She went to bed and awakened this morning still not feeling well. She went to make some breakfast and became very weak and collapsed to the ground. Her son was in the next room and immediately came to her assistance. The patient reports that she eased herself to the ground and did not hit hard. She then subsequently developed some vomiting. Patient denies that she has focal weakness of the legs but reports feeling generally weak. She reports she also has a PMR and now she is getting a lot of aching in her hips as well. Past Medical History:  Diagnosis Date  . Hypertension   . Migraine   . Polymyalgia rheumatica [facility])     Patient Active Problem List   Diagnosis Date Noted  . Migraine with aura and without status migrainosus, not intractable 11/23/2015  . Polymyalgia rheumatica ([place]) 03/29/2014  . Migraines 03/27/2014  . Dizziness 03/27/2014    Past Surgical History:  Procedure Laterality Date  . ABDOMINAL HYSTERECTOMY      OB History    No data available       Home Medications    Prior to Admission medications   Medication Sig Start Date End Date Taking? Authorizing Provider  aspirin-acetaminophen-caffeine (EXCEDRIN EXTRA STRENGTH) [phone] MG tablet  Take 1 tablet by mouth every 6 (six) hours as needed for headache.   Yes Historical Provider, MD  Calcium Carbonate-Vit D-Min (CALTRATE 600+D PLUS MINERALS) 600-800 MG-UNIT TABS Take 1 tablet by mouth 2 (two) times daily.   Yes Historical Provider, MD  cyclobenzaprine (FLEXERIL) 10 MG tablet Take 10 mg by mouth 3 (three) times daily as needed for muscle spasms.   Yes Historical Provider, MD  hydrochlorothiazide (HYDRODIURIL) 25 MG tablet Take 25 mg by mouth daily.   Yes Historical Provider, MD  omeprazole (PRILOSEC) 20 MG capsule Take 20 mg by mouth daily.   Yes Historical Provider, MD  predniSONE (DELTASONE) 1 MG tablet Take 4 mg by mouth daily with breakfast.   Yes Historical Provider, MD  diphenhydrAMINE (BENADRYL) 25 mg capsule Take 1 capsule (25 mg total) by mouth every 6 (six) hours as needed. TAKE WITH REGLAN FOR MIGRAINE HEADACHE 01/26/17   Charlesetta Shanks, MD  metoCLOPramide (REGLAN) 10 MG tablet Take 1 tablet (10 mg total) by mouth every 6 (six) hours. 01/26/17   Charlesetta Shanks, MD  potassium chloride SA (K-DUR,KLOR-CON) 20 MEQ tablet Take 1 tablet (20 mEq total) by mouth 2 (two) times daily. 01/26/17   Charlesetta Shanks, MD    Family History Family History  Problem Relation Age of Onset  . Heart Problems Father   . Heart attack Father   . Scleroderma Mother   . Heart Problems Mother  Social History Social History  Substance Use Topics  . Smoking status: Never Smoker  . Smokeless tobacco: Never Used  . Alcohol use No     Allergies   Amoxicillin and Sulfa antibiotics   Review of Systems Review of Systems 10 Systems reviewed and are negative for acute change except as noted in the HPI.   Physical Exam Updated Vital Signs BP (!) 155/54   Pulse (!) 132   Temp 99.5 F (37.5 C) (Oral)   Resp 16   SpO2 (!) 85%   Physical Exam  Constitutional: She is oriented to person, place, and time. She appears well-developed and well-nourished. No distress.  The patient is alert and  nontoxic. But she does appear fatigued and uncomfortable. No respiratory distress.  HENT:  Head: Normocephalic and atraumatic.  Bilateral TMs normal. Posterior oropharynx widely patent no erythema or exudate. Dentition in good condition.  Eyes: Conjunctivae and EOM are normal. Pupils are equal, round, and reactive to light.  Neck: Neck supple. No thyromegaly present.  Cardiovascular: Normal rate, regular rhythm, normal heart sounds and intact distal pulses.   No murmur heard. Pulmonary/Chest: Effort normal and breath sounds normal. No respiratory distress.  Abdominal: Soft. She exhibits no distension. There is no tenderness.  Musculoskeletal: Normal range of motion. She exhibits tenderness. She exhibits no edema or deformity.  Patient does not have peripheral edema or calf tenderness. She does however report hip pain with range of motion and neurologic testing which she reports is due to her PMR.  Lymphadenopathy:    She has no cervical adenopathy.  Neurological: She is alert and oriented to person, place, and time. No cranial nerve deficit or sensory deficit. She exhibits normal muscle tone. Coordination normal.  Normal bilateral finger-nose exam. No pronator drift. Normal cognitive function. Normal speech.  Skin: Skin is warm and dry.  Psychiatric: She has a normal mood and affect.  Nursing note and vitals reviewed.    ED Treatments / Results  Labs (all labs ordered are listed, but only abnormal results are displayed) Labs Reviewed  COMPREHENSIVE METABOLIC PANEL - Abnormal; Notable for the following:       Result Value   Sodium 132 (*)    Potassium 3.0 (*)    Chloride 92 (*)    Glucose, Bld 122 (*)    All other components within normal limits  CBC WITH DIFFERENTIAL/PLATELET - Abnormal; Notable for the following:    WBC 10.7 (*)    All other components within normal limits  URINALYSIS, ROUTINE W REFLEX MICROSCOPIC - Abnormal; Notable for the following:    APPearance HAZY (*)     Hgb urine dipstick SMALL (*)    Leukocytes, UA SMALL (*)    Bacteria, UA FEW (*)    Squamous Epithelial / LPF 0-5 (*)    All other components within normal limits  PROTIME-INR  LIPASE, BLOOD  I-STAT TROPOININ, ED    EKG  EKG Interpretation  Date/Time:  Tuesday January 26 2017 10:28:22 EDT Ventricular Rate:  55 PR Interval:    QRS Duration: 107 QT Interval:  473 QTC Calculation: 453 R Axis:   71 Text Interpretation:  Sinus rhythm RSR' in V1 or V2, right VCD or RVH Abnormal T, consider ischemia, diffuse leads agree. no STEMI. no old comparison Confirmed by Johnney Killian, MD, Jeannie Done [phone]) on 01/26/2017 10:32:15 AM       Radiology Ct Head Wo Contrast  Result Date: 01/26/2017 CLINICAL DATA:  76 year old hypertensive female with occipital migraine for 2 days.  Onset of dizziness and near syncope today. Initial encounter. EXAM: CT HEAD WITHOUT CONTRAST TECHNIQUE: Contiguous axial images were obtained from the base of the skull through the vertex without intravenous contrast. COMPARISON:  None. 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 Mild to moderate chronic microvascular changes. No intracranial hemorrhage or CT evidence of large acute infarct. No hydrocephalus. No intracranial mass lesion noted on this unenhanced exam. Vascular: Vascular calcifications. Ectatic vertebral arteries and basilar artery Skull: No worrisome abnormality. Sinuses/Orbits: Post lens replacement without acute orbital abnormality. Visualized paranasal sinuses are clear. 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 Mild to moderate chronic microvascular changes. No intracranial hemorrhage or CT evidence of large acute infarct. Electronically Signed   By: Genia Del M.D.   On: 01/26/2017 12:02   Mr Brain Wo Contrast (neuro Protocol)  Result Date: 01/26/2017 CLINICAL DATA:  Initial evaluation for acute occipital headache, nausea,  vomiting, dizziness. EXAM: MRI HEAD WITHOUT CONTRAST MRA HEAD WITHOUT CONTRAST TECHNIQUE: Multiplanar, multiecho pulse sequences of the brain and surrounding structures were obtained without intravenous contrast. Angiographic images of the head were obtained using MRA technique without contrast. COMPARISON:  Prior CT from earlier the same day. FINDINGS: MRI HEAD FINDINGS Brain: Cerebral volume within normal limits for age. Mild patchy T2/FLAIR hyperintensity within the periventricular and deep white matter both cerebral hemispheres, most likely related to chronic microvascular ischemic disease. Mild chronic small vessel ischemic changes present within the pons as well. No abnormal foci of restricted diffusion to suggest acute or subacute ischemia. Gray-white matter differentiation maintained. No evidence for acute or chronic intracranial hemorrhage. No evidence for acute or chronic intracranial hemorrhage. Small remote lacunar infarct noted at the genu of the right internal capsule. No other areas of remote infarction identified. No mass lesion, midline shift or mass effect. Ventricles normal in size without evidence for hydrocephalus. No extra-axial fluid collection. Major dural sinuses are grossly patent. Pituitary gland and suprasellar region within normal limits. Midline structures intact and normal. Vascular: Major intracranial vascular flow voids are maintained. Left vertebral artery mildly tortuous and invaginate CED on the brainstem/ pons, likely of doubtful significance. Skull and upper cervical spine: Craniocervical junction within normal limits. Multilevel degenerative spondylolysis noted within the visualized upper cervical spine without significant stenosis. Bone marrow signal intensity within normal limits. No scalp soft tissue abnormality. Sinuses/Orbits: Globes and orbital soft tissues within normal limits. Patient status post lens extraction bilaterally. Mild scattered mucosal thickening within the  ethmoidal air cells. Paranasal sinuses are otherwise clear. Small right mastoid effusion noted. Inner ear structures normal. Other: No other significant finding. MRA HEAD FINDINGS ANTERIOR CIRCULATION: Distal cervical segments of the internal carotid arteries are patent with antegrade flow. Petrous, cavernous, and supraclinoid segments are widely patent without flow limiting stenosis. A1 segments widely patent. Anterior communicating artery normal. Anterior cerebral arteries demonstrate mild atheromatous irregularity without high-grade flow-limiting stenosis. ACA is are patent to their distal aspects. M1 segments patent without stenosis or occlusion. No proximal M2 occlusion. Distal MCA branches well opacified and symmetric. Mild distal small vessel atheromatous irregularity. POSTERIOR CIRCULATION: Left vertebral artery dominant and mildly tortuous crossing midline towards the vertebrobasilar junction. Right vertebral artery mildly hypoplastic. Vertebral arteries are patent bilaterally without flow limiting stenosis or other abnormality. Posterior inferior cerebral arteries patent proximally. Basilar artery widely patent to its distal aspect without stenosis or other abnormality. Superior cerebral arteries patent bilaterally. Both of the posterior cerebral arteries supplied via the  basilar and are widely patent to their distal aspects. PCAs demonstrate mild atheromatous irregularity without flow-limiting stenosis. No aneurysm or vascular malformation. IMPRESSION: MRI HEAD IMPRESSION: 1. No acute intracranial infarct or other process identified. 2. 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 MRA HEAD IMPRESSION: Mild atheromatous changes involving the anterior and posterior circulation as above. No high-grade flow-limiting or correctable stenosis. No other acute abnormality within the intracranial circulation. Electronically Signed   By: Jeannine Boga M.D.   On: 01/26/2017 14:58   Mr Jodene Nam  Head (cerebral Arteries)  Result Date: 01/26/2017 CLINICAL DATA:  Initial evaluation for acute occipital headache, nausea, vomiting, dizziness. EXAM: MRI HEAD WITHOUT CONTRAST MRA HEAD WITHOUT CONTRAST TECHNIQUE: Multiplanar, multiecho pulse sequences of the brain and surrounding structures were obtained without intravenous contrast. Angiographic images of the head were obtained using MRA technique without contrast. COMPARISON:  Prior CT from earlier the same day. FINDINGS: MRI HEAD FINDINGS Brain: Cerebral volume within normal limits for age. Mild patchy T2/FLAIR hyperintensity within the periventricular and deep white matter both cerebral hemispheres, most likely related to chronic microvascular ischemic disease. Mild chronic small vessel ischemic changes present within the pons as well. No abnormal foci of restricted diffusion to suggest acute or subacute ischemia. Gray-white matter differentiation maintained. No evidence for acute or chronic intracranial hemorrhage. No evidence for acute or chronic intracranial hemorrhage. Small remote lacunar infarct noted at the genu of the right internal capsule. No other areas of remote infarction identified. No mass lesion, midline shift or mass effect. Ventricles normal in size without evidence for hydrocephalus. No extra-axial fluid collection. Major dural sinuses are grossly patent. Pituitary gland and suprasellar region within normal limits. Midline structures intact and normal. Vascular: Major intracranial vascular flow voids are maintained. Left vertebral artery mildly tortuous and invaginate CED on the brainstem/ pons, likely of doubtful significance. Skull and upper cervical spine: Craniocervical junction within normal limits. Multilevel degenerative spondylolysis noted within the visualized upper cervical spine without significant stenosis. Bone marrow signal intensity within normal limits. No scalp soft tissue abnormality. Sinuses/Orbits: Globes and orbital soft  tissues within normal limits. Patient status post lens extraction bilaterally. Mild scattered mucosal thickening within the ethmoidal air cells. Paranasal sinuses are otherwise clear. Small right mastoid effusion noted. Inner ear structures normal. Other: No other significant finding. MRA HEAD FINDINGS ANTERIOR CIRCULATION: Distal cervical segments of the internal carotid arteries are patent with antegrade flow. Petrous, cavernous, and supraclinoid segments are widely patent without flow limiting stenosis. A1 segments widely patent. Anterior communicating artery normal. Anterior cerebral arteries demonstrate mild atheromatous irregularity without high-grade flow-limiting stenosis. ACA is are patent to their distal aspects. M1 segments patent without stenosis or occlusion. No proximal M2 occlusion. Distal MCA branches well opacified and symmetric. Mild distal small vessel atheromatous irregularity. POSTERIOR CIRCULATION: Left vertebral artery dominant and mildly tortuous crossing midline towards the vertebrobasilar junction. Right vertebral artery mildly hypoplastic. Vertebral arteries are patent bilaterally without flow limiting stenosis or other abnormality. Posterior inferior cerebral arteries patent proximally. Basilar artery widely patent to its distal aspect without stenosis or other abnormality. Superior cerebral arteries patent bilaterally. Both of the posterior cerebral arteries supplied via the basilar and are widely patent to their distal aspects. PCAs demonstrate mild atheromatous irregularity without flow-limiting stenosis. No aneurysm or vascular malformation. IMPRESSION: MRI HEAD IMPRESSION: 1. No acute intracranial infarct or other process identified. 2. 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 MRA HEAD IMPRESSION: Mild atheromatous changes involving the anterior and posterior circulation as above.  No high-grade flow-limiting or correctable stenosis. No other acute abnormality  within the intracranial circulation. Electronically Signed   By: Jeannine Boga M.D.   On: 01/26/2017 14:58    Procedures Procedures (including critical care time)  Medications Ordered in ED Medications  sodium chloride 0.9 % bolus 500 mL (not administered)  sodium chloride 0.9 % bolus 500 mL (0 mLs Intravenous Stopped 01/26/17 1146)  metoCLOPramide (REGLAN) injection 10 mg (10 mg Intravenous Given 01/26/17 1044)  diphenhydrAMINE (BENADRYL) injection 25 mg (25 mg Intravenous Given 01/26/17 1044)  dexamethasone (DECADRON) injection 10 mg (10 mg Intravenous Given 01/26/17 1044)  potassium chloride SA (K-DUR,KLOR-CON) CR tablet 40 mEq (40 mEq Oral Given 01/26/17 1120)  potassium chloride 30 mEq in sodium chloride 0.9 % 265 mL (KCL MULTIRUN) IVPB (30 mEq Intravenous Given 01/26/17 1144)  0.9 %  sodium chloride infusion ( Intravenous Stopped 01/26/17 1533)     Initial Impression / Assessment and Plan / ED Course  I have reviewed the triage vital signs and the nursing notes.  Pertinent labs & imaging results that were available during my care of the patient were reviewed by me and considered in my medical decision making (see chart for details).      Final Clinical Impressions(s) / ED Diagnoses   Final diagnoses:  Complicated migraine  Hypokalemia  Hyponatremia   The patient had headache symptoms yesterday suggestive of migraine however today she had atypical symptoms of weakness and near syncope. Headache had been severe posterior at onset yesterday. CT showed older infarct area. MRI\MRA obtained to rule out CVA as well as posterior circulation dissection or aneurysm. None present. Patient also has mild hypokalemia and mild hyponatremia. She reports that she did not eat yesterday due to her migrainous symptoms. She did develop some vomiting this morning as well. Patient also identifies that when she develops headache or other incipient illness her PMR tends to flare. She reports in conjunction  with this she is experienced increased pain in her hips. Patient was treated for pain with Reglan and Benadryl for migraine and Decadron for migraine as well as augmenting treatment for PMR flare. At time of discharge patient feels much improved and vital signs are stable. She is counseled following up with her PCP for continued monitoring of mild hypokalemia and hyponatremia which I anticipate will resolve with a short course of supplemental potassium and return to normal dietary intake. She is however aware that this needs to be closely monitored until resolved. Return precautions are reviewed. New Prescriptions New Prescriptions   DIPHENHYDRAMINE (BENADRYL) 25 MG CAPSULE    Take 1 capsule (25 mg total) by mouth every 6 (six) hours as needed. TAKE WITH REGLAN FOR MIGRAINE HEADACHE   METOCLOPRAMIDE (REGLAN) 10 MG TABLET    Take 1 tablet (10 mg total) by mouth every 6 (six) hours.   POTASSIUM CHLORIDE SA (K-DUR,KLOR-CON) 20 MEQ TABLET    Take 1 tablet (20 mEq total) by mouth 2 (two) times daily.     Charlesetta Shanks, MD 01/26/17 [phone]

## 2017-02-02 DIAGNOSIS — K219 Gastro-esophageal reflux disease without esophagitis: Secondary | ICD-10-CM | POA: Diagnosis not present

## 2017-02-02 DIAGNOSIS — I10 Essential (primary) hypertension: Secondary | ICD-10-CM | POA: Diagnosis not present

## 2017-02-02 DIAGNOSIS — E785 Hyperlipidemia, unspecified: Secondary | ICD-10-CM | POA: Diagnosis not present

## 2017-02-02 DIAGNOSIS — M353 Polymyalgia rheumatica: Secondary | ICD-10-CM | POA: Diagnosis not present

## 2017-03-02 DIAGNOSIS — G43909 Migraine, unspecified, not intractable, without status migrainosus: Secondary | ICD-10-CM | POA: Diagnosis not present

## 2017-03-02 DIAGNOSIS — I10 Essential (primary) hypertension: Secondary | ICD-10-CM | POA: Diagnosis not present

## 2017-03-02 DIAGNOSIS — M353 Polymyalgia rheumatica: Secondary | ICD-10-CM | POA: Diagnosis not present

## 2017-03-02 DIAGNOSIS — E785 Hyperlipidemia, unspecified: Secondary | ICD-10-CM | POA: Diagnosis not present

## 2017-03-10 DIAGNOSIS — E785 Hyperlipidemia, unspecified: Secondary | ICD-10-CM | POA: Diagnosis not present

## 2017-03-10 DIAGNOSIS — I10 Essential (primary) hypertension: Secondary | ICD-10-CM | POA: Diagnosis not present

## 2017-04-21 DIAGNOSIS — Z7952 Long term (current) use of systemic steroids: Secondary | ICD-10-CM | POA: Diagnosis not present

## 2017-04-21 DIAGNOSIS — M150 Primary generalized (osteo)arthritis: Secondary | ICD-10-CM | POA: Diagnosis not present

## 2017-04-21 DIAGNOSIS — M353 Polymyalgia rheumatica: Secondary | ICD-10-CM | POA: Diagnosis not present

## 2017-04-21 DIAGNOSIS — M8589 Other specified disorders of bone density and structure, multiple sites: Secondary | ICD-10-CM | POA: Diagnosis not present

## 2017-05-03 DIAGNOSIS — Z961 Presence of intraocular lens: Secondary | ICD-10-CM | POA: Diagnosis not present

## 2017-05-03 DIAGNOSIS — H59032 Cystoid macular edema following cataract surgery, left eye: Secondary | ICD-10-CM | POA: Diagnosis not present

## 2017-07-23 DIAGNOSIS — M353 Polymyalgia rheumatica: Secondary | ICD-10-CM | POA: Diagnosis not present

## 2017-07-23 DIAGNOSIS — M150 Primary generalized (osteo)arthritis: Secondary | ICD-10-CM | POA: Diagnosis not present

## 2017-07-23 DIAGNOSIS — Z7952 Long term (current) use of systemic steroids: Secondary | ICD-10-CM | POA: Diagnosis not present

## 2017-07-23 DIAGNOSIS — M8589 Other specified disorders of bone density and structure, multiple sites: Secondary | ICD-10-CM | POA: Diagnosis not present

## 2017-08-11 DIAGNOSIS — Z7189 Other specified counseling: Secondary | ICD-10-CM | POA: Diagnosis not present

## 2017-08-11 DIAGNOSIS — Z23 Encounter for immunization: Secondary | ICD-10-CM | POA: Diagnosis not present

## 2017-08-11 DIAGNOSIS — Z0000 Encounter for general adult medical examination without abnormal findings: Secondary | ICD-10-CM | POA: Diagnosis not present

## 2017-08-11 DIAGNOSIS — Z8619 Personal history of other infectious and parasitic diseases: Secondary | ICD-10-CM | POA: Diagnosis not present

## 2017-08-11 DIAGNOSIS — I10 Essential (primary) hypertension: Secondary | ICD-10-CM | POA: Diagnosis not present

## 2017-08-11 DIAGNOSIS — G43109 Migraine with aura, not intractable, without status migrainosus: Secondary | ICD-10-CM | POA: Diagnosis not present

## 2017-08-11 DIAGNOSIS — K219 Gastro-esophageal reflux disease without esophagitis: Secondary | ICD-10-CM | POA: Diagnosis not present

## 2017-08-11 DIAGNOSIS — Z1389 Encounter for screening for other disorder: Secondary | ICD-10-CM | POA: Diagnosis not present

## 2017-08-11 DIAGNOSIS — M353 Polymyalgia rheumatica: Secondary | ICD-10-CM | POA: Diagnosis not present

## 2017-08-11 DIAGNOSIS — M85859 Other specified disorders of bone density and structure, unspecified thigh: Secondary | ICD-10-CM | POA: Diagnosis not present

## 2017-08-11 DIAGNOSIS — E785 Hyperlipidemia, unspecified: Secondary | ICD-10-CM | POA: Diagnosis not present

## 2017-09-15 DIAGNOSIS — Z1231 Encounter for screening mammogram for malignant neoplasm of breast: Secondary | ICD-10-CM | POA: Diagnosis not present

## 2017-11-04 DIAGNOSIS — M150 Primary generalized (osteo)arthritis: Secondary | ICD-10-CM | POA: Diagnosis not present

## 2017-11-04 DIAGNOSIS — M8589 Other specified disorders of bone density and structure, multiple sites: Secondary | ICD-10-CM | POA: Diagnosis not present

## 2017-11-04 DIAGNOSIS — M353 Polymyalgia rheumatica: Secondary | ICD-10-CM | POA: Diagnosis not present

## 2017-11-04 DIAGNOSIS — Z7952 Long term (current) use of systemic steroids: Secondary | ICD-10-CM | POA: Diagnosis not present

## 2017-11-04 DIAGNOSIS — M25559 Pain in unspecified hip: Secondary | ICD-10-CM | POA: Diagnosis not present

## 2017-11-10 DIAGNOSIS — E785 Hyperlipidemia, unspecified: Secondary | ICD-10-CM | POA: Diagnosis not present

## 2017-11-10 DIAGNOSIS — I10 Essential (primary) hypertension: Secondary | ICD-10-CM | POA: Diagnosis not present

## 2018-04-04 DIAGNOSIS — M8589 Other specified disorders of bone density and structure, multiple sites: Secondary | ICD-10-CM | POA: Diagnosis not present

## 2018-04-04 DIAGNOSIS — M25559 Pain in unspecified hip: Secondary | ICD-10-CM | POA: Diagnosis not present

## 2018-04-04 DIAGNOSIS — Z7952 Long term (current) use of systemic steroids: Secondary | ICD-10-CM | POA: Diagnosis not present

## 2018-04-04 DIAGNOSIS — M150 Primary generalized (osteo)arthritis: Secondary | ICD-10-CM | POA: Diagnosis not present

## 2018-04-04 DIAGNOSIS — M353 Polymyalgia rheumatica: Secondary | ICD-10-CM | POA: Diagnosis not present

## 2018-04-04 DIAGNOSIS — M542 Cervicalgia: Secondary | ICD-10-CM | POA: Diagnosis not present

## 2018-07-05 DIAGNOSIS — Z7952 Long term (current) use of systemic steroids: Secondary | ICD-10-CM | POA: Diagnosis not present

## 2018-07-05 DIAGNOSIS — M7070 Other bursitis of hip, unspecified hip: Secondary | ICD-10-CM | POA: Diagnosis not present

## 2018-07-05 DIAGNOSIS — M353 Polymyalgia rheumatica: Secondary | ICD-10-CM | POA: Diagnosis not present

## 2018-07-05 DIAGNOSIS — M542 Cervicalgia: Secondary | ICD-10-CM | POA: Diagnosis not present

## 2018-07-05 DIAGNOSIS — M8589 Other specified disorders of bone density and structure, multiple sites: Secondary | ICD-10-CM | POA: Diagnosis not present

## 2018-07-05 DIAGNOSIS — M150 Primary generalized (osteo)arthritis: Secondary | ICD-10-CM | POA: Diagnosis not present

## 2018-08-12 DIAGNOSIS — I10 Essential (primary) hypertension: Secondary | ICD-10-CM | POA: Diagnosis not present

## 2018-08-12 DIAGNOSIS — Z1389 Encounter for screening for other disorder: Secondary | ICD-10-CM | POA: Diagnosis not present

## 2018-08-12 DIAGNOSIS — Z0000 Encounter for general adult medical examination without abnormal findings: Secondary | ICD-10-CM | POA: Diagnosis not present

## 2018-08-12 DIAGNOSIS — M85859 Other specified disorders of bone density and structure, unspecified thigh: Secondary | ICD-10-CM | POA: Diagnosis not present

## 2018-08-12 DIAGNOSIS — M353 Polymyalgia rheumatica: Secondary | ICD-10-CM | POA: Diagnosis not present

## 2018-08-12 DIAGNOSIS — E785 Hyperlipidemia, unspecified: Secondary | ICD-10-CM | POA: Diagnosis not present

## 2018-08-12 DIAGNOSIS — Z23 Encounter for immunization: Secondary | ICD-10-CM | POA: Diagnosis not present

## 2018-09-19 DIAGNOSIS — Z803 Family history of malignant neoplasm of breast: Secondary | ICD-10-CM | POA: Diagnosis not present

## 2018-09-19 DIAGNOSIS — Z1231 Encounter for screening mammogram for malignant neoplasm of breast: Secondary | ICD-10-CM | POA: Diagnosis not present

## 2018-10-06 DIAGNOSIS — M810 Age-related osteoporosis without current pathological fracture: Secondary | ICD-10-CM | POA: Diagnosis not present

## 2019-02-14 DIAGNOSIS — I10 Essential (primary) hypertension: Secondary | ICD-10-CM | POA: Diagnosis not present

## 2019-02-14 DIAGNOSIS — M353 Polymyalgia rheumatica: Secondary | ICD-10-CM | POA: Diagnosis not present

## 2019-02-14 DIAGNOSIS — E785 Hyperlipidemia, unspecified: Secondary | ICD-10-CM | POA: Diagnosis not present

## 2019-07-05 DIAGNOSIS — M150 Primary generalized (osteo)arthritis: Secondary | ICD-10-CM | POA: Diagnosis not present

## 2019-07-05 DIAGNOSIS — M7070 Other bursitis of hip, unspecified hip: Secondary | ICD-10-CM | POA: Diagnosis not present

## 2019-07-05 DIAGNOSIS — M353 Polymyalgia rheumatica: Secondary | ICD-10-CM | POA: Diagnosis not present

## 2019-07-05 DIAGNOSIS — M542 Cervicalgia: Secondary | ICD-10-CM | POA: Diagnosis not present

## 2019-07-05 DIAGNOSIS — Z7952 Long term (current) use of systemic steroids: Secondary | ICD-10-CM | POA: Diagnosis not present

## 2019-07-05 DIAGNOSIS — M8589 Other specified disorders of bone density and structure, multiple sites: Secondary | ICD-10-CM | POA: Diagnosis not present

## 2019-07-20 DIAGNOSIS — M810 Age-related osteoporosis without current pathological fracture: Secondary | ICD-10-CM | POA: Diagnosis not present

## 2019-08-23 DIAGNOSIS — I10 Essential (primary) hypertension: Secondary | ICD-10-CM | POA: Diagnosis not present

## 2019-08-23 DIAGNOSIS — Z1389 Encounter for screening for other disorder: Secondary | ICD-10-CM | POA: Diagnosis not present

## 2019-08-23 DIAGNOSIS — G43109 Migraine with aura, not intractable, without status migrainosus: Secondary | ICD-10-CM | POA: Diagnosis not present

## 2019-08-23 DIAGNOSIS — E785 Hyperlipidemia, unspecified: Secondary | ICD-10-CM | POA: Diagnosis not present

## 2019-08-23 DIAGNOSIS — M1990 Unspecified osteoarthritis, unspecified site: Secondary | ICD-10-CM | POA: Diagnosis not present

## 2019-08-23 DIAGNOSIS — Z86010 Personal history of colonic polyps: Secondary | ICD-10-CM | POA: Diagnosis not present

## 2019-08-23 DIAGNOSIS — Z23 Encounter for immunization: Secondary | ICD-10-CM | POA: Diagnosis not present

## 2019-08-23 DIAGNOSIS — M353 Polymyalgia rheumatica: Secondary | ICD-10-CM | POA: Diagnosis not present

## 2019-08-23 DIAGNOSIS — Z0000 Encounter for general adult medical examination without abnormal findings: Secondary | ICD-10-CM | POA: Diagnosis not present

## 2019-09-25 DIAGNOSIS — Z803 Family history of malignant neoplasm of breast: Secondary | ICD-10-CM | POA: Diagnosis not present

## 2019-09-25 DIAGNOSIS — Z1231 Encounter for screening mammogram for malignant neoplasm of breast: Secondary | ICD-10-CM | POA: Diagnosis not present

## 2020-08-23 ENCOUNTER — Other Ambulatory Visit: Payer: Self-pay

## 2020-08-23 ENCOUNTER — Ambulatory Visit: Payer: Medicare Other | Admitting: Sports Medicine

## 2020-08-23 ENCOUNTER — Ambulatory Visit: Payer: Medicare Other

## 2020-08-23 ENCOUNTER — Encounter: Payer: Self-pay | Admitting: Sports Medicine

## 2020-08-23 DIAGNOSIS — M79674 Pain in right toe(s): Secondary | ICD-10-CM | POA: Diagnosis not present

## 2020-08-23 DIAGNOSIS — L909 Atrophic disorder of skin, unspecified: Secondary | ICD-10-CM | POA: Diagnosis not present

## 2020-08-23 DIAGNOSIS — Q828 Other specified congenital malformations of skin: Secondary | ICD-10-CM

## 2020-08-23 DIAGNOSIS — M79671 Pain in right foot: Secondary | ICD-10-CM

## 2020-08-23 DIAGNOSIS — M353 Polymyalgia rheumatica: Secondary | ICD-10-CM

## 2020-08-23 DIAGNOSIS — M7741 Metatarsalgia, right foot: Secondary | ICD-10-CM | POA: Diagnosis not present

## 2020-08-23 NOTE — Progress Notes (Signed)
Subjective: Madeline Graham is a 79 y.o. female patient who presents to office for evaluation of Right foot pain secondary to callus skin. Patient complains of pain at the lesion present Right foot at the ball worse at the big toe joint plantarly.  Patient has tried seeing her rheumatologist who did x-rays and said that there was some sort of thing that they saw on the x-ray and recommended that she come here.  Patient reports that it is sore tender when she walks but does not hurt with shoes and states that the pain is dull to this area. Patient denies any other pedal complaints.   Review of systems noncontributory  Patient Active Problem List   Diagnosis Date Noted   Migraine with aura and without status migrainosus, not intractable 11/23/2015   Polymyalgia rheumatica ([place]) 03/29/2014   Migraines 03/27/2014   Dizziness 03/27/2014    Current Outpatient Medications on File Prior to Visit  Medication Sig Dispense Refill   Coenzyme Q10 (CO Q-10 PO) Take by mouth.     atorvastatin (LIPITOR) 10 MG tablet Take 10 mg by mouth daily.     Calcium Carbonate-Vit D-Min (CALTRATE 600+D PLUS MINERALS) 600-800 MG-UNIT TABS Take 1 tablet by mouth 2 (two) times daily.     hydrochlorothiazide (HYDRODIURIL) 25 MG tablet Take 25 mg by mouth daily.     potassium chloride (KLOR-CON) 10 MEQ tablet Take 10 mEq by mouth daily.     predniSONE (DELTASONE) 1 MG tablet Take 4 mg by mouth daily with breakfast.     No current facility-administered medications on file prior to visit.    Allergies  Allergen Reactions   Amoxicillin Rash   Sulfa Antibiotics Nausea And Vomiting and Rash    Objective:  General: Alert and oriented x3 in no acute distress  Dermatology: Keratotic lesion present submet 1 and 2 on right with skin lines transversing the lesion, pain is present with direct pressure to the lesion with a central nucleated core noted, no webspace macerations, no ecchymosis bilateral, all nails x 10  are well manicured.  Vascular: Dorsalis Pedis and Posterior Tibial pedal pulses 1/4, Capillary Fill Time 5 seconds, scant pedal hair growth bilateral, no edema bilateral lower extremities, varicosities noted bilateral, temperature gradient within normal limits.  Neurology: Johney Maine sensation intact via light touch bilateral.  Musculoskeletal: Mild tenderness with palpation at the keratotic lesion site on right foot.  Muscular strength 5/5 in all groups without pain or limitation on range of motion.  Right greater than left bunion deformity and significant fat pad atrophy bilateral.  Assessment and Plan: Problem List Items Addressed This Visit      Other   Polymyalgia rheumatica ([place])    Other Visit Diagnoses    Pain of right great toe    -  Primary   Porokeratosis       Metatarsalgia of right foot       Fat pad atrophy of foot       Right foot pain          -Complete examination performed -Discussed treatment options -Parred keratoic lesion using a chisel/15 blade x2; treated the area withSalinocaine covered with Band-Aid -Encouraged daily skin emollients gave sample of foot miracle cream -Advised good supportive shoes and advised use of metatarsal sleeve as dispensed this visit -Patient to return to office as needed or sooner if condition worsens.  Landis Martins, DPM

## 2020-11-07 DIAGNOSIS — M150 Primary generalized (osteo)arthritis: Secondary | ICD-10-CM | POA: Diagnosis not present

## 2020-11-07 DIAGNOSIS — M7070 Other bursitis of hip, unspecified hip: Secondary | ICD-10-CM | POA: Diagnosis not present

## 2020-11-07 DIAGNOSIS — M353 Polymyalgia rheumatica: Secondary | ICD-10-CM | POA: Diagnosis not present

## 2020-11-07 DIAGNOSIS — Z7952 Long term (current) use of systemic steroids: Secondary | ICD-10-CM | POA: Diagnosis not present

## 2020-11-07 DIAGNOSIS — M8589 Other specified disorders of bone density and structure, multiple sites: Secondary | ICD-10-CM | POA: Diagnosis not present

## 2021-03-05 DIAGNOSIS — Z0000 Encounter for general adult medical examination without abnormal findings: Secondary | ICD-10-CM | POA: Diagnosis not present

## 2021-03-05 DIAGNOSIS — Z1389 Encounter for screening for other disorder: Secondary | ICD-10-CM | POA: Diagnosis not present

## 2021-03-05 DIAGNOSIS — G43109 Migraine with aura, not intractable, without status migrainosus: Secondary | ICD-10-CM | POA: Diagnosis not present

## 2021-03-05 DIAGNOSIS — Z7189 Other specified counseling: Secondary | ICD-10-CM | POA: Diagnosis not present

## 2021-03-05 DIAGNOSIS — K219 Gastro-esophageal reflux disease without esophagitis: Secondary | ICD-10-CM | POA: Diagnosis not present

## 2021-03-05 DIAGNOSIS — Z8619 Personal history of other infectious and parasitic diseases: Secondary | ICD-10-CM | POA: Diagnosis not present

## 2021-03-05 DIAGNOSIS — M810 Age-related osteoporosis without current pathological fracture: Secondary | ICD-10-CM | POA: Diagnosis not present

## 2021-03-05 DIAGNOSIS — I10 Essential (primary) hypertension: Secondary | ICD-10-CM | POA: Diagnosis not present

## 2021-03-05 DIAGNOSIS — E785 Hyperlipidemia, unspecified: Secondary | ICD-10-CM | POA: Diagnosis not present

## 2021-03-06 DIAGNOSIS — M810 Age-related osteoporosis without current pathological fracture: Secondary | ICD-10-CM | POA: Diagnosis not present

## 2021-05-07 DIAGNOSIS — M8589 Other specified disorders of bone density and structure, multiple sites: Secondary | ICD-10-CM | POA: Diagnosis not present

## 2021-05-07 DIAGNOSIS — Z7952 Long term (current) use of systemic steroids: Secondary | ICD-10-CM | POA: Diagnosis not present

## 2021-05-07 DIAGNOSIS — M7070 Other bursitis of hip, unspecified hip: Secondary | ICD-10-CM | POA: Diagnosis not present

## 2021-05-07 DIAGNOSIS — M353 Polymyalgia rheumatica: Secondary | ICD-10-CM | POA: Diagnosis not present

## 2021-05-07 DIAGNOSIS — M150 Primary generalized (osteo)arthritis: Secondary | ICD-10-CM | POA: Diagnosis not present

## 2021-05-07 DIAGNOSIS — M810 Age-related osteoporosis without current pathological fracture: Secondary | ICD-10-CM | POA: Diagnosis not present

## 2021-08-25 DIAGNOSIS — Z7952 Long term (current) use of systemic steroids: Secondary | ICD-10-CM | POA: Diagnosis not present

## 2021-08-25 DIAGNOSIS — M353 Polymyalgia rheumatica: Secondary | ICD-10-CM | POA: Diagnosis not present

## 2021-08-25 DIAGNOSIS — M8589 Other specified disorders of bone density and structure, multiple sites: Secondary | ICD-10-CM | POA: Diagnosis not present

## 2021-08-25 DIAGNOSIS — M7070 Other bursitis of hip, unspecified hip: Secondary | ICD-10-CM | POA: Diagnosis not present

## 2021-08-25 DIAGNOSIS — M150 Primary generalized (osteo)arthritis: Secondary | ICD-10-CM | POA: Diagnosis not present

## 2021-09-01 DIAGNOSIS — M810 Age-related osteoporosis without current pathological fracture: Secondary | ICD-10-CM | POA: Diagnosis not present

## 2021-09-01 DIAGNOSIS — I10 Essential (primary) hypertension: Secondary | ICD-10-CM | POA: Diagnosis not present

## 2021-09-01 DIAGNOSIS — E785 Hyperlipidemia, unspecified: Secondary | ICD-10-CM | POA: Diagnosis not present

## 2021-09-01 DIAGNOSIS — Z23 Encounter for immunization: Secondary | ICD-10-CM | POA: Diagnosis not present

## 2021-09-24 DIAGNOSIS — M7070 Other bursitis of hip, unspecified hip: Secondary | ICD-10-CM | POA: Diagnosis not present

## 2021-09-24 DIAGNOSIS — M8589 Other specified disorders of bone density and structure, multiple sites: Secondary | ICD-10-CM | POA: Diagnosis not present

## 2021-09-24 DIAGNOSIS — M150 Primary generalized (osteo)arthritis: Secondary | ICD-10-CM | POA: Diagnosis not present

## 2021-09-24 DIAGNOSIS — M353 Polymyalgia rheumatica: Secondary | ICD-10-CM | POA: Diagnosis not present

## 2021-09-24 DIAGNOSIS — Z7952 Long term (current) use of systemic steroids: Secondary | ICD-10-CM | POA: Diagnosis not present

## 2021-10-28 DIAGNOSIS — J069 Acute upper respiratory infection, unspecified: Secondary | ICD-10-CM | POA: Diagnosis not present

## 2021-10-28 DIAGNOSIS — Z03818 Encounter for observation for suspected exposure to other biological agents ruled out: Secondary | ICD-10-CM | POA: Diagnosis not present

## 2021-11-29 ENCOUNTER — Emergency Department (HOSPITAL_COMMUNITY): Payer: Medicare Other

## 2021-11-29 ENCOUNTER — Encounter (HOSPITAL_COMMUNITY): Payer: Self-pay

## 2021-11-29 ENCOUNTER — Emergency Department (HOSPITAL_COMMUNITY)
Admission: EM | Admit: 2021-11-29 | Discharge: 2021-11-29 | Disposition: A | Discharge status: Home / Self Care | Payer: Medicare Other | Attending: Emergency Medicine | Admitting: Emergency Medicine

## 2021-11-29 DIAGNOSIS — M542 Cervicalgia: Secondary | ICD-10-CM | POA: Insufficient documentation

## 2021-11-29 DIAGNOSIS — R930 Abnormal findings on diagnostic imaging of skull and head, not elsewhere classified: Secondary | ICD-10-CM | POA: Diagnosis not present

## 2021-11-29 DIAGNOSIS — I10 Essential (primary) hypertension: Secondary | ICD-10-CM | POA: Diagnosis not present

## 2021-11-29 DIAGNOSIS — S4991XA Unspecified injury of right shoulder and upper arm, initial encounter: Secondary | ICD-10-CM | POA: Diagnosis present

## 2021-11-29 DIAGNOSIS — Y9301 Activity, walking, marching and hiking: Secondary | ICD-10-CM | POA: Insufficient documentation

## 2021-11-29 DIAGNOSIS — W19XXXA Unspecified fall, initial encounter: Secondary | ICD-10-CM | POA: Diagnosis not present

## 2021-11-29 DIAGNOSIS — Z743 Need for continuous supervision: Secondary | ICD-10-CM | POA: Diagnosis not present

## 2021-11-29 DIAGNOSIS — S0993XA Unspecified injury of face, initial encounter: Secondary | ICD-10-CM | POA: Diagnosis not present

## 2021-11-29 DIAGNOSIS — R6889 Other general symptoms and signs: Secondary | ICD-10-CM | POA: Diagnosis not present

## 2021-11-29 DIAGNOSIS — R9431 Abnormal electrocardiogram [ECG] [EKG]: Secondary | ICD-10-CM | POA: Diagnosis not present

## 2021-11-29 DIAGNOSIS — W01198A Fall on same level from slipping, tripping and stumbling with subsequent striking against other object, initial encounter: Secondary | ICD-10-CM | POA: Diagnosis not present

## 2021-11-29 DIAGNOSIS — R110 Nausea: Secondary | ICD-10-CM | POA: Diagnosis not present

## 2021-11-29 DIAGNOSIS — S0990XA Unspecified injury of head, initial encounter: Secondary | ICD-10-CM | POA: Diagnosis not present

## 2021-11-29 DIAGNOSIS — Y92093 Driveway of other non-institutional residence as the place of occurrence of the external cause: Secondary | ICD-10-CM | POA: Diagnosis not present

## 2021-11-29 DIAGNOSIS — S43004A Unspecified dislocation of right shoulder joint, initial encounter: Secondary | ICD-10-CM | POA: Diagnosis not present

## 2021-11-29 DIAGNOSIS — J0100 Acute maxillary sinusitis, unspecified: Secondary | ICD-10-CM | POA: Diagnosis not present

## 2021-11-29 DIAGNOSIS — S01511A Laceration without foreign body of lip, initial encounter: Secondary | ICD-10-CM

## 2021-11-29 DIAGNOSIS — Z4789 Encounter for other orthopedic aftercare: Secondary | ICD-10-CM | POA: Diagnosis not present

## 2021-11-29 DIAGNOSIS — S199XXA Unspecified injury of neck, initial encounter: Secondary | ICD-10-CM | POA: Diagnosis not present

## 2021-11-29 DIAGNOSIS — M25519 Pain in unspecified shoulder: Secondary | ICD-10-CM | POA: Diagnosis not present

## 2021-11-29 DIAGNOSIS — S43014A Anterior dislocation of right humerus, initial encounter: Secondary | ICD-10-CM | POA: Diagnosis not present

## 2021-11-29 MED ORDER — ONDANSETRON HCL 4 MG/2ML IJ SOLN
4.0000 mg | Freq: Once | INTRAMUSCULAR | Status: AC
  Administered 2021-11-29: 4 mg via INTRAVENOUS
  Filled 2021-11-29: qty 2

## 2021-11-29 MED ORDER — FENTANYL CITRATE PF 50 MCG/ML IJ SOSY
50.0000 ug | PREFILLED_SYRINGE | Freq: Once | INTRAMUSCULAR | Status: AC
  Administered 2021-11-29: 50 ug via INTRAVENOUS
  Filled 2021-11-29: qty 1

## 2021-11-29 MED ORDER — HYDROCODONE-ACETAMINOPHEN 5-325 MG PO TABS: 1.0000 | ORAL_TABLET | Freq: Four times a day (QID) | ORAL | 0 refills | Status: AC | PRN

## 2021-11-29 MED ORDER — HYDROMORPHONE HCL 1 MG/ML IJ SOLN
1.0000 mg | Freq: Once | INTRAMUSCULAR | Status: AC
  Administered 2021-11-29: 1 mg via INTRAVENOUS
  Filled 2021-11-29: qty 1

## 2021-11-29 MED ORDER — LIDOCAINE HCL 2 % IJ SOLN
10.0000 mL | Freq: Once | INTRAMUSCULAR | Status: AC
Start: 2021-11-29 — End: 2021-11-29
  Administered 2021-11-29: 200 mg
  Filled 2021-11-29: qty 20

## 2021-11-29 MED ORDER — PROPOFOL 10 MG/ML IV BOLUS
0.5000 mg/kg | Freq: Once | INTRAVENOUS | Status: AC
  Administered 2021-11-29: 30.5 mg via INTRAVENOUS
  Filled 2021-11-29: qty 20

## 2021-11-29 MED ORDER — PROPOFOL 10 MG/ML IV BOLUS
INTRAVENOUS | Status: AC | PRN
Start: 2021-11-29 — End: 2021-11-29
  Administered 2021-11-29: 30 mg via INTRAVENOUS

## 2021-11-29 MED ORDER — HYDROCODONE-ACETAMINOPHEN 5-325 MG PO TABS
1.0000 | ORAL_TABLET | Freq: Four times a day (QID) | ORAL | 0 refills | Status: DC | PRN
Start: 2021-11-29 — End: 2021-11-29

## 2021-11-29 NOTE — ED Provider Notes (Signed)
LACERATION REPAIR Performed by: Alyse Low Authorized by: Alyse Low Consent: Verbal consent obtained. Risks and benefits: risks, benefits and alternatives were discussed Consent given by: patient Patient identity confirmed: provided demographic data Prepped and Draped in normal sterile fashion Wound explored  Laceration Location: upper lip 71mm v shaped laceration   Laceration Length: cm  No Foreign Bodies seen or palpated  Anesthesia: local infiltration   Local anesthetic: lidocaine 1%  Anesthetic total: 1 ml  Irrigation method: syringe Amount of cleaning: standard  Skin closure: simple interrupted  Number of sutures: 2 sutures 6.0 vicryl   Technique: simple  Patient tolerance: Patient tolerated the procedure well with no immediate complications.   Reduction of dislocation Date/Time: 10:25 PM Performed by: Alyse Low Authorized by: Alyse Low Consent: Verbal consent obtained. Risks and benefits: risks, benefits and alternatives were discussed Consent given by: patient Required items: required blood products, implants, devices, and special equipment available Time out: Immediately prior to procedure a "time out" was called to verify the correct patient, procedure, equipment, support staff and site/side marked as required.    Vitals: Vital signs were monitored during sedation. Patient tolerance: Patient tolerated the procedure well with no immediate complications. Reduction of dislocation Date/Time: 10:25 PM Performed by: Alyse Low Authorized by: Alyse Low Consent: Verbal consent obtained. Risks and benefits: risks, benefits and alternatives were discussed Consent given by: patient Required items: required blood products, implants, devices, and special equipment available Time out: Immediately prior to procedure a "time out" was called to verify the correct patient, procedure, equipment, support staff and site/side marked as required.  Patient sedated:  Dr. Karle Starch performed sedation   Vitals: Vital signs were monitored during sedation. Patient tolerance: Patient tolerated the procedure well with no immediate complications. Joint: Right shoulder.  Reduction technique: scapular pressure with extension and abduction of shoulder   Post reduction xray  normal alignment      Madeline Graham 11/29/21 2231    Madeline Hidden, MD 11/29/21 2316

## 2021-11-29 NOTE — Progress Notes (Signed)
Orthopedic Tech Progress Note Patient Details:  Madeline Graham 06/11/1941 [id number]  Ortho Devices Type of Ortho Device: Sling immobilizer Ortho Device/Splint Location: rue Ortho Device/Splint Interventions: Ordered, Application, Adjustment  I applied post reduction. Post Interventions Patient Tolerated: Well Instructions Provided: Care of device, Adjustment of device  Karolee Stamps 11/29/2021, 10:03 PM

## 2021-11-29 NOTE — ED Provider Notes (Signed)
[facility]  Provider Note  CSN: [id number] Arrival date & time: 11/29/21 1851  History Chief Complaint  Patient presents with   Lytle Michaels    Madeline Graham is a 81 y.o. female reports she stumbled walking in her driveway just prior to arrival, falling forward and hitting her face and R arm on the ground. Complaining of face, neck and R shoulder pain. EMS reports they gave her 153mcg fentanyl and 4mg  zofran enroute with minimal improvement. Patient is not on blood thinners.    Home Medications Prior to Admission medications   Medication Sig Start Date End Date Taking? Authorizing Provider  HYDROcodone-acetaminophen (NORCO/VICODIN) 5-325 MG tablet Take 1 tablet by mouth every 6 (six) hours as needed for severe pain. 11/29/21  Yes Truddie Hidden, MD  atorvastatin (LIPITOR) 10 MG tablet Take 10 mg by mouth daily. 05/27/20   [provider]  Calcium Carbonate-Vit D-Min (CALTRATE 600+D PLUS MINERALS) 600-800 MG-UNIT TABS Take 1 tablet by mouth 2 (two) times daily.    [provider]  Coenzyme Q10 (CO Q-10 PO) Take by mouth.    [provider]  hydrochlorothiazide (HYDRODIURIL) 25 MG tablet Take 25 mg by mouth daily.    [provider]  potassium chloride (KLOR-CON) 10 MEQ tablet Take 10 mEq by mouth daily. 08/14/20   [provider]  predniSONE (DELTASONE) 1 MG tablet Take 4 mg by mouth daily with breakfast.    [provider]     Allergies    Amoxicillin and Sulfa antibiotics   Review of Systems   Review of Systems Please see HPI for pertinent positives and negatives  Physical Exam BP (!) 197/72    Pulse 80    Temp 98 F (36.7 C) (Oral)    Resp 17    Ht 5\' 2"  (1.575 m)    Wt 61 kg    SpO2 97%    BMI 24.60 kg/m   Physical Exam Vitals and nursing note reviewed.  Constitutional:      Appearance: Normal appearance.  HENT:     Head: Normocephalic.     Comments: Laceration mid upper lip,  dislodged partial dentures, no acute dental fractures apparent    Nose: Nose normal.     Mouth/Throat:     Mouth: Mucous membranes are moist.  Eyes:     Extraocular Movements: Extraocular movements intact.     Conjunctiva/sclera: Conjunctivae normal.  Neck:     Comments: Placed in c-collar for neck tenderness Cardiovascular:     Rate and Rhythm: Normal rate.  Pulmonary:     Effort: Pulmonary effort is normal.     Breath sounds: Normal breath sounds.  Abdominal:     General: Abdomen is flat.     Palpations: Abdomen is soft.     Tenderness: There is no abdominal tenderness.  Musculoskeletal:        General: Tenderness (R shoulder) and deformity present.     Cervical back: Tenderness present.     Comments: Patient is distally NVI  Skin:    General: Skin is warm and dry.  Neurological:     General: No focal deficit present.     Mental Status: She is alert.  Psychiatric:        Mood and Affect: Mood normal.    ED Results / Procedures / Treatments   EKG EKG Interpretation  Date/Time:  Saturday November 29 2021 19:09:30 EST Ventricular Rate:  66 PR Interval:  173 QRS Duration:  114 QT Interval:  532 QTC Calculation: 558 R Axis:   46 Text Interpretation: Sinus rhythm Borderline intraventricular conduction delay Abnormal R-wave progression, early transition Borderline T abnormalities, lateral leads Prolonged QT interval Since last tracing T wave inversion has decreased Confirmed by Calvert Cantor [phone]) on 11/29/2021 7:15:02 PM  Procedures .Sedation  Date/Time: 11/29/2021 10:30 PM Performed by: Truddie Hidden, MD Authorized by: Truddie Hidden, MD   Consent:    Consent obtained:  Written   Consent given by:  Patient   Risks discussed:  Prolonged hypoxia resulting in organ damage, prolonged sedation necessitating reversal, inadequate sedation and respiratory compromise necessitating ventilatory assistance and intubation Universal protocol:    Immediately prior to  procedure, a time out was called: yes   Indications:    Procedure performed:  Dislocation reduction   Procedure necessitating sedation performed by:  Different physician Pre-sedation assessment:    Time since last food or drink:  8   ASA classification: class 2 - patient with mild systemic disease     Mouth opening:  2 finger widths   Thyromental distance:  3 finger widths   Mallampati score:  II - soft palate, uvula, fauces visible   Neck mobility: normal     Pre-sedation assessments completed and reviewed: airway patency, cardiovascular function, hydration status, mental status, nausea/vomiting, pain level, respiratory function and temperature     Pre-sedation assessment completed:  11/29/2021 10:00 AM Immediate pre-procedure details:    Reassessment: Patient reassessed immediately prior to procedure     Reviewed: vital signs     Verified: bag valve mask available, emergency equipment available, intubation equipment available, IV patency confirmed, oxygen available, reversal medications available and suction available   Procedure details (see MAR for exact dosages):    Preoxygenation:  Nasal cannula   Sedation:  Propofol   Intended level of sedation: deep   Analgesia:  Fentanyl   Intra-procedure monitoring:  Blood pressure monitoring, cardiac monitor, continuous capnometry, continuous pulse oximetry, frequent LOC assessments and frequent vital sign checks   Intra-procedure events: hypoxia     Intra-procedure management:  BVM ventilation   Total Provider sedation time (minutes):  15 Post-procedure details:    Post-sedation assessment completed:  11/29/2021 10:31 PM   Attendance: Constant attendance by certified staff until patient recovered     Recovery: Patient returned to pre-procedure baseline     Post-sedation assessments completed and reviewed: airway patency, cardiovascular function, hydration status, mental status, nausea/vomiting and pain level     Patient is stable for discharge  or admission: yes     Procedure completion:  Tolerated well, no immediate complications  Medications Ordered in the ED Medications  HYDROmorphone (DILAUDID) injection 1 mg (1 mg Intravenous Given 11/29/21 1914)  lidocaine (XYLOCAINE) 2 % (with pres) injection 200 mg (200 mg Other Given 11/29/21 1945)  ondansetron (ZOFRAN) injection 4 mg (4 mg Intravenous Given 11/29/21 2003)  propofol (DIPRIVAN) 10 mg/mL bolus/IV push 30.5 mg (30.5 mg Intravenous Given 11/29/21 2150)  fentaNYL (SUBLIMAZE) injection 50 mcg (50 mcg Intravenous Given 11/29/21 2142)  propofol (DIPRIVAN) 10 mg/mL bolus/IV push (30 mg Intravenous Given 11/29/21 2153)    Initial Impression and Plan  Patient with a mechanical fall has facial injuries and pain in R shoulder with possible dislocation vs fracture. Will check basic labs, EKG and send for imaging of head, face, c-spine and shoulder.   ED Course   Clinical Course as of 11/29/21 2240  Sat Nov 29, 2021  1926 Odette Horns shows an anterior  shoulder dislocation with possible hill-sachs deformity.  [CS]  2135 CT images negative for injury. Patient reports improved pain after intra-articular injection. Will proceed with attempt to reduce. Discussed procedural sedation with patient and family at bedside.  [CS]  2232 Shoulder reduced with Alyse Low, [facility], please see her note for details. Patient had brief hypoxia, improved with BVM. Lip laceration sutured by Alyse Low, [facility] and student.  [CS]    Clinical Course User Index [CS] Truddie Hidden, MD     MDM Rules/Calculators/A&P Medical Decision Making Problems Addressed: Fall, initial encounter: acute illness or injury that poses a threat to life or bodily functions Lip laceration, initial encounter: self-limited or minor problem Shoulder dislocation, right, initial encounter: acute illness or injury that poses a threat to life or bodily functions  Amount and/or Complexity of Data Reviewed Radiology: ordered and independent  interpretation performed. Decision-making details documented in ED Course.  Risk Prescription drug management. Parenteral controlled substances. Decision regarding hospitalization.    Final Clinical Impression(s) / ED Diagnoses Final diagnoses:  Fall, initial encounter  Lip laceration, initial encounter  Shoulder dislocation, right, initial encounter    Rx / DC Orders ED Discharge Orders          Ordered    HYDROcodone-acetaminophen (NORCO/VICODIN) 5-325 MG tablet  Every 6 hours PRN        11/29/21 2240             Truddie Hidden, MD 11/29/21 2240

## 2021-11-29 NOTE — Sedation Documentation (Signed)
Pt noted to be coming around, initiating own respirations.

## 2021-11-29 NOTE — Sedation Documentation (Signed)
Successful reduction by MD Karle Starch

## 2021-11-29 NOTE — Sedation Documentation (Signed)
Pt apneic, BVM initiated by RT.

## 2021-11-29 NOTE — ED Notes (Signed)
MD Karle Starch at bedside attempting reduction

## 2021-11-29 NOTE — ED Triage Notes (Signed)
Pt BIB [facility] for eval s/p fall. Pt reports she had mechanical fall while walking in driveway. Struck face. Knocked some teeth out during fall. Pt reports R sided shoulder pain, moderate facial trauma. Denies LOC, denies thinners. 133mcg fent, 4 mg zofran by EMS

## 2021-12-10 DIAGNOSIS — M25511 Pain in right shoulder: Secondary | ICD-10-CM | POA: Diagnosis not present

## 2021-12-10 DIAGNOSIS — R200 Anesthesia of skin: Secondary | ICD-10-CM | POA: Diagnosis not present

## 2021-12-10 DIAGNOSIS — J320 Chronic maxillary sinusitis: Secondary | ICD-10-CM | POA: Diagnosis not present

## 2021-12-24 DIAGNOSIS — M25511 Pain in right shoulder: Secondary | ICD-10-CM | POA: Diagnosis not present

## 2021-12-24 DIAGNOSIS — S43014A Anterior dislocation of right humerus, initial encounter: Secondary | ICD-10-CM | POA: Diagnosis not present

## 2021-12-25 DIAGNOSIS — M25511 Pain in right shoulder: Secondary | ICD-10-CM | POA: Diagnosis not present

## 2021-12-25 DIAGNOSIS — S43004A Unspecified dislocation of right shoulder joint, initial encounter: Secondary | ICD-10-CM | POA: Diagnosis not present

## 2021-12-29 DIAGNOSIS — M353 Polymyalgia rheumatica: Secondary | ICD-10-CM | POA: Diagnosis not present

## 2021-12-29 DIAGNOSIS — R2689 Other abnormalities of gait and mobility: Secondary | ICD-10-CM | POA: Diagnosis not present

## 2021-12-29 DIAGNOSIS — M810 Age-related osteoporosis without current pathological fracture: Secondary | ICD-10-CM | POA: Diagnosis not present

## 2021-12-29 DIAGNOSIS — M8589 Other specified disorders of bone density and structure, multiple sites: Secondary | ICD-10-CM | POA: Diagnosis not present

## 2021-12-29 DIAGNOSIS — Z7952 Long term (current) use of systemic steroids: Secondary | ICD-10-CM | POA: Diagnosis not present

## 2021-12-29 DIAGNOSIS — M150 Primary generalized (osteo)arthritis: Secondary | ICD-10-CM | POA: Diagnosis not present

## 2021-12-30 DIAGNOSIS — S43004A Unspecified dislocation of right shoulder joint, initial encounter: Secondary | ICD-10-CM | POA: Diagnosis not present

## 2021-12-30 DIAGNOSIS — M25511 Pain in right shoulder: Secondary | ICD-10-CM | POA: Diagnosis not present

## 2022-01-02 DIAGNOSIS — S43004A Unspecified dislocation of right shoulder joint, initial encounter: Secondary | ICD-10-CM | POA: Diagnosis not present

## 2022-01-02 DIAGNOSIS — M25511 Pain in right shoulder: Secondary | ICD-10-CM | POA: Diagnosis not present

## 2022-01-06 DIAGNOSIS — S43004A Unspecified dislocation of right shoulder joint, initial encounter: Secondary | ICD-10-CM | POA: Diagnosis not present

## 2022-01-06 DIAGNOSIS — M25511 Pain in right shoulder: Secondary | ICD-10-CM | POA: Diagnosis not present

## 2022-01-08 DIAGNOSIS — S43004A Unspecified dislocation of right shoulder joint, initial encounter: Secondary | ICD-10-CM | POA: Diagnosis not present

## 2022-01-08 DIAGNOSIS — M25511 Pain in right shoulder: Secondary | ICD-10-CM | POA: Diagnosis not present

## 2022-01-13 DIAGNOSIS — S43004A Unspecified dislocation of right shoulder joint, initial encounter: Secondary | ICD-10-CM | POA: Diagnosis not present

## 2022-01-13 DIAGNOSIS — M25511 Pain in right shoulder: Secondary | ICD-10-CM | POA: Diagnosis not present

## 2022-01-14 DIAGNOSIS — S43014A Anterior dislocation of right humerus, initial encounter: Secondary | ICD-10-CM | POA: Diagnosis not present

## 2022-01-15 DIAGNOSIS — M25511 Pain in right shoulder: Secondary | ICD-10-CM | POA: Diagnosis not present

## 2022-01-15 DIAGNOSIS — S43004A Unspecified dislocation of right shoulder joint, initial encounter: Secondary | ICD-10-CM | POA: Diagnosis not present

## 2022-01-20 DIAGNOSIS — S43004A Unspecified dislocation of right shoulder joint, initial encounter: Secondary | ICD-10-CM | POA: Diagnosis not present

## 2022-01-20 DIAGNOSIS — M25511 Pain in right shoulder: Secondary | ICD-10-CM | POA: Diagnosis not present

## 2022-01-22 DIAGNOSIS — S43004A Unspecified dislocation of right shoulder joint, initial encounter: Secondary | ICD-10-CM | POA: Diagnosis not present

## 2022-01-22 DIAGNOSIS — M25511 Pain in right shoulder: Secondary | ICD-10-CM | POA: Diagnosis not present

## 2022-01-27 DIAGNOSIS — M25511 Pain in right shoulder: Secondary | ICD-10-CM | POA: Diagnosis not present

## 2022-01-27 DIAGNOSIS — S43004A Unspecified dislocation of right shoulder joint, initial encounter: Secondary | ICD-10-CM | POA: Diagnosis not present

## 2022-01-29 DIAGNOSIS — M25511 Pain in right shoulder: Secondary | ICD-10-CM | POA: Diagnosis not present

## 2022-01-29 DIAGNOSIS — S43004A Unspecified dislocation of right shoulder joint, initial encounter: Secondary | ICD-10-CM | POA: Diagnosis not present

## 2022-02-03 DIAGNOSIS — S43004A Unspecified dislocation of right shoulder joint, initial encounter: Secondary | ICD-10-CM | POA: Diagnosis not present

## 2022-02-03 DIAGNOSIS — M25511 Pain in right shoulder: Secondary | ICD-10-CM | POA: Diagnosis not present

## 2022-02-10 DIAGNOSIS — S43004A Unspecified dislocation of right shoulder joint, initial encounter: Secondary | ICD-10-CM | POA: Diagnosis not present

## 2022-02-10 DIAGNOSIS — M25511 Pain in right shoulder: Secondary | ICD-10-CM | POA: Diagnosis not present

## 2022-02-18 DIAGNOSIS — S43004A Unspecified dislocation of right shoulder joint, initial encounter: Secondary | ICD-10-CM | POA: Diagnosis not present

## 2022-02-18 DIAGNOSIS — M25511 Pain in right shoulder: Secondary | ICD-10-CM | POA: Diagnosis not present

## 2022-02-23 DIAGNOSIS — M25511 Pain in right shoulder: Secondary | ICD-10-CM | POA: Diagnosis not present

## 2022-02-23 DIAGNOSIS — S43004A Unspecified dislocation of right shoulder joint, initial encounter: Secondary | ICD-10-CM | POA: Diagnosis not present

## 2022-02-25 DIAGNOSIS — S43004A Unspecified dislocation of right shoulder joint, initial encounter: Secondary | ICD-10-CM | POA: Diagnosis not present

## 2022-02-25 DIAGNOSIS — M25511 Pain in right shoulder: Secondary | ICD-10-CM | POA: Diagnosis not present

## 2022-03-02 DIAGNOSIS — S43004A Unspecified dislocation of right shoulder joint, initial encounter: Secondary | ICD-10-CM | POA: Diagnosis not present

## 2022-03-02 DIAGNOSIS — M25511 Pain in right shoulder: Secondary | ICD-10-CM | POA: Diagnosis not present

## 2022-03-04 DIAGNOSIS — M25511 Pain in right shoulder: Secondary | ICD-10-CM | POA: Diagnosis not present

## 2022-03-04 DIAGNOSIS — S43004A Unspecified dislocation of right shoulder joint, initial encounter: Secondary | ICD-10-CM | POA: Diagnosis not present

## 2022-03-10 DIAGNOSIS — E785 Hyperlipidemia, unspecified: Secondary | ICD-10-CM | POA: Diagnosis not present

## 2022-03-10 DIAGNOSIS — Z23 Encounter for immunization: Secondary | ICD-10-CM | POA: Diagnosis not present

## 2022-03-10 DIAGNOSIS — I10 Essential (primary) hypertension: Secondary | ICD-10-CM | POA: Diagnosis not present

## 2022-03-10 DIAGNOSIS — Z0000 Encounter for general adult medical examination without abnormal findings: Secondary | ICD-10-CM | POA: Diagnosis not present

## 2022-03-10 DIAGNOSIS — M1990 Unspecified osteoarthritis, unspecified site: Secondary | ICD-10-CM | POA: Diagnosis not present

## 2022-03-10 DIAGNOSIS — M353 Polymyalgia rheumatica: Secondary | ICD-10-CM | POA: Diagnosis not present

## 2022-03-10 DIAGNOSIS — G43109 Migraine with aura, not intractable, without status migrainosus: Secondary | ICD-10-CM | POA: Diagnosis not present

## 2022-03-10 DIAGNOSIS — M810 Age-related osteoporosis without current pathological fracture: Secondary | ICD-10-CM | POA: Diagnosis not present

## 2022-03-12 DIAGNOSIS — M25511 Pain in right shoulder: Secondary | ICD-10-CM | POA: Diagnosis not present

## 2022-03-12 DIAGNOSIS — S43004A Unspecified dislocation of right shoulder joint, initial encounter: Secondary | ICD-10-CM | POA: Diagnosis not present

## 2022-03-17 DIAGNOSIS — M25511 Pain in right shoulder: Secondary | ICD-10-CM | POA: Diagnosis not present

## 2022-03-17 DIAGNOSIS — S43004A Unspecified dislocation of right shoulder joint, initial encounter: Secondary | ICD-10-CM | POA: Diagnosis not present

## 2022-03-26 DIAGNOSIS — M150 Primary generalized (osteo)arthritis: Secondary | ICD-10-CM | POA: Diagnosis not present

## 2022-03-26 DIAGNOSIS — Z7952 Long term (current) use of systemic steroids: Secondary | ICD-10-CM | POA: Diagnosis not present

## 2022-03-26 DIAGNOSIS — M8589 Other specified disorders of bone density and structure, multiple sites: Secondary | ICD-10-CM | POA: Diagnosis not present

## 2022-03-26 DIAGNOSIS — M353 Polymyalgia rheumatica: Secondary | ICD-10-CM | POA: Diagnosis not present

## 2022-03-26 DIAGNOSIS — R2689 Other abnormalities of gait and mobility: Secondary | ICD-10-CM | POA: Diagnosis not present

## 2022-07-02 DIAGNOSIS — M810 Age-related osteoporosis without current pathological fracture: Secondary | ICD-10-CM | POA: Diagnosis not present

## 2022-08-27 DIAGNOSIS — Z7952 Long term (current) use of systemic steroids: Secondary | ICD-10-CM | POA: Diagnosis not present

## 2022-08-27 DIAGNOSIS — M25562 Pain in left knee: Secondary | ICD-10-CM | POA: Diagnosis not present

## 2022-08-27 DIAGNOSIS — R2689 Other abnormalities of gait and mobility: Secondary | ICD-10-CM | POA: Diagnosis not present

## 2022-08-27 DIAGNOSIS — M150 Primary generalized (osteo)arthritis: Secondary | ICD-10-CM | POA: Diagnosis not present

## 2022-08-27 DIAGNOSIS — M25511 Pain in right shoulder: Secondary | ICD-10-CM | POA: Diagnosis not present

## 2022-08-27 DIAGNOSIS — M8589 Other specified disorders of bone density and structure, multiple sites: Secondary | ICD-10-CM | POA: Diagnosis not present

## 2022-08-27 DIAGNOSIS — M353 Polymyalgia rheumatica: Secondary | ICD-10-CM | POA: Diagnosis not present

## 2022-08-27 DIAGNOSIS — Z23 Encounter for immunization: Secondary | ICD-10-CM | POA: Diagnosis not present

## 2022-09-11 DIAGNOSIS — I10 Essential (primary) hypertension: Secondary | ICD-10-CM | POA: Diagnosis not present

## 2022-09-11 DIAGNOSIS — E785 Hyperlipidemia, unspecified: Secondary | ICD-10-CM | POA: Diagnosis not present

## 2022-09-11 DIAGNOSIS — M353 Polymyalgia rheumatica: Secondary | ICD-10-CM | POA: Diagnosis not present

## 2022-09-11 DIAGNOSIS — M810 Age-related osteoporosis without current pathological fracture: Secondary | ICD-10-CM | POA: Diagnosis not present

## 2022-11-30 DIAGNOSIS — M8589 Other specified disorders of bone density and structure, multiple sites: Secondary | ICD-10-CM | POA: Diagnosis not present

## 2022-11-30 DIAGNOSIS — R2689 Other abnormalities of gait and mobility: Secondary | ICD-10-CM | POA: Diagnosis not present

## 2022-11-30 DIAGNOSIS — M25562 Pain in left knee: Secondary | ICD-10-CM | POA: Diagnosis not present

## 2022-11-30 DIAGNOSIS — M353 Polymyalgia rheumatica: Secondary | ICD-10-CM | POA: Diagnosis not present

## 2022-11-30 DIAGNOSIS — M150 Primary generalized (osteo)arthritis: Secondary | ICD-10-CM | POA: Diagnosis not present

## 2022-11-30 DIAGNOSIS — Z23 Encounter for immunization: Secondary | ICD-10-CM | POA: Diagnosis not present

## 2022-11-30 DIAGNOSIS — M25511 Pain in right shoulder: Secondary | ICD-10-CM | POA: Diagnosis not present

## 2022-11-30 DIAGNOSIS — Z7952 Long term (current) use of systemic steroids: Secondary | ICD-10-CM | POA: Diagnosis not present

## 2022-12-10 DIAGNOSIS — M353 Polymyalgia rheumatica: Secondary | ICD-10-CM | POA: Diagnosis not present

## 2022-12-10 DIAGNOSIS — R54 Age-related physical debility: Secondary | ICD-10-CM | POA: Diagnosis not present

## 2022-12-10 DIAGNOSIS — R739 Hyperglycemia, unspecified: Secondary | ICD-10-CM | POA: Diagnosis not present

## 2023-01-02 IMAGING — CT CT CERVICAL SPINE W/O CM
3 of 4 series · 13 of 33 positions shown, 16 images · non-contrast
Comparison: None.

CLINICAL DATA: Neck trauma (Age >= 65y).  Fall.



[Series 4: c_spine 2.0 st · axial · 0.30mm/px · z∈[-254,-142]mm · 5 of 86 slices shown, 7 images]
[im 15/86  soft-tissue]
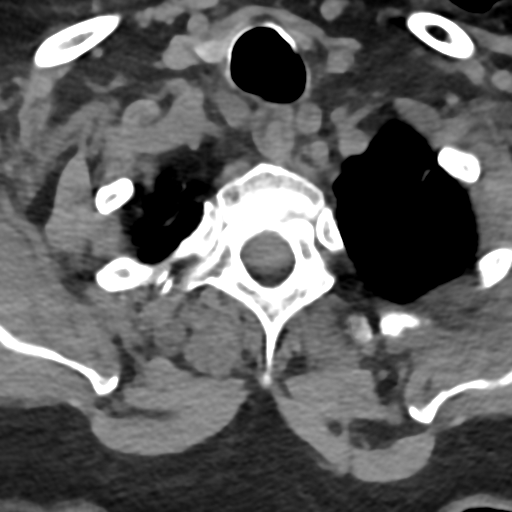
[im 15/86  bone]
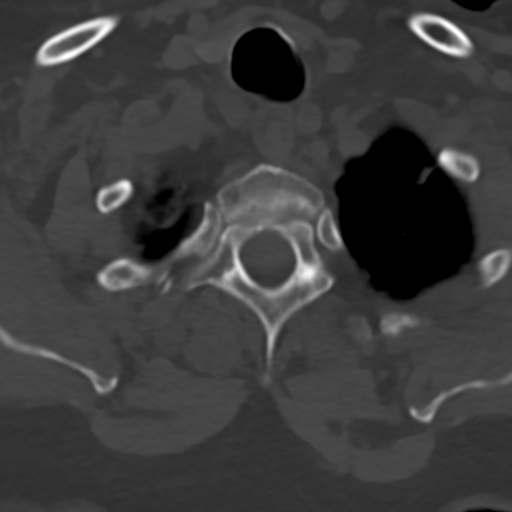
[im 29/86  bone]
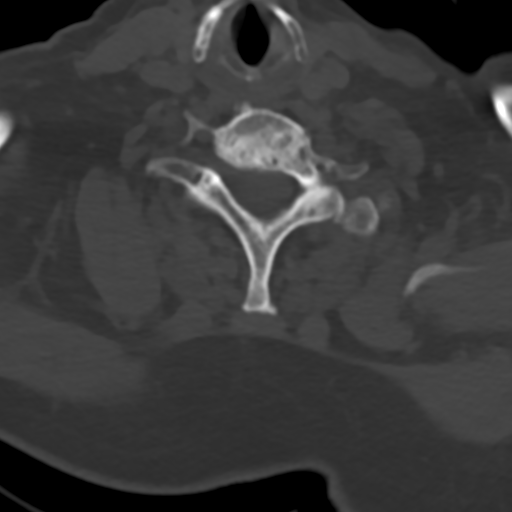
[im 43/86  bone]
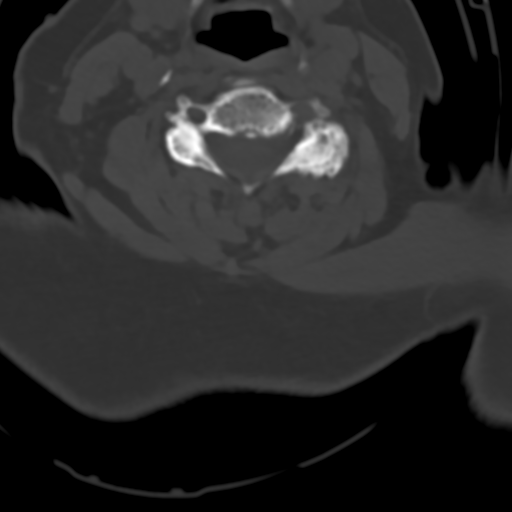
[im 57/86  bone]
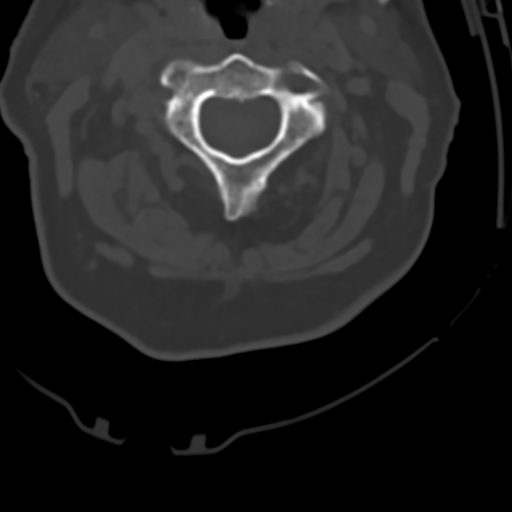
[im 71/86  soft-tissue]
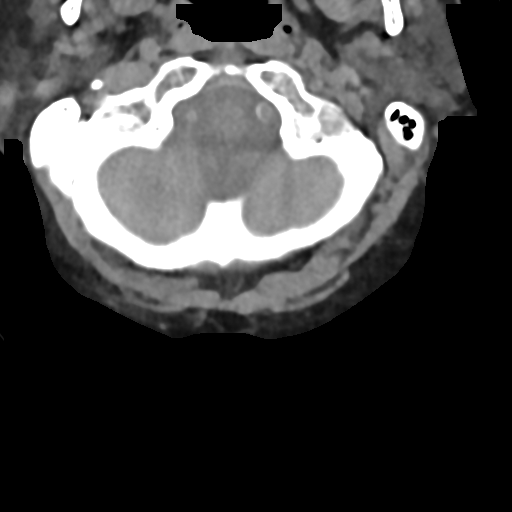
[im 71/86  bone]
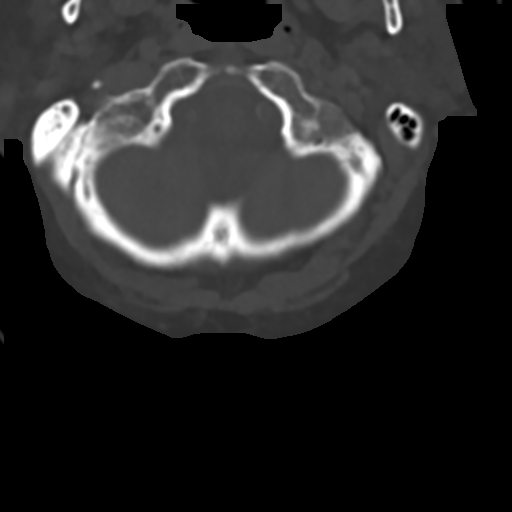

[Series 6: c_spine 2.0 sag bone · sagittal · 0.31mm/px · 5 of 61 slices shown, 6 images]
[im 21/61  bone]
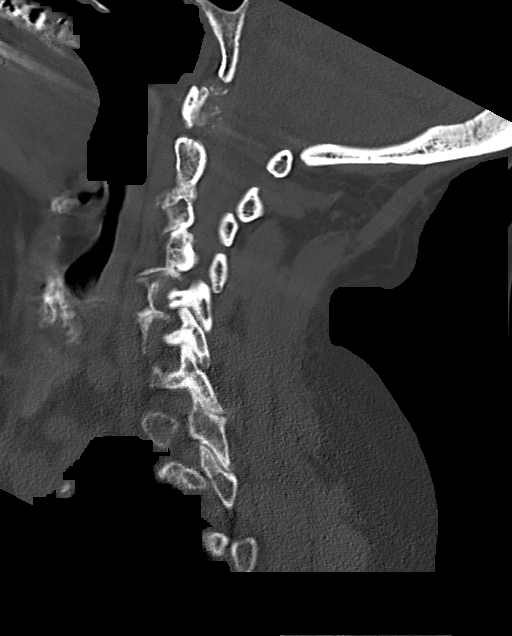
[im 26/61  bone]
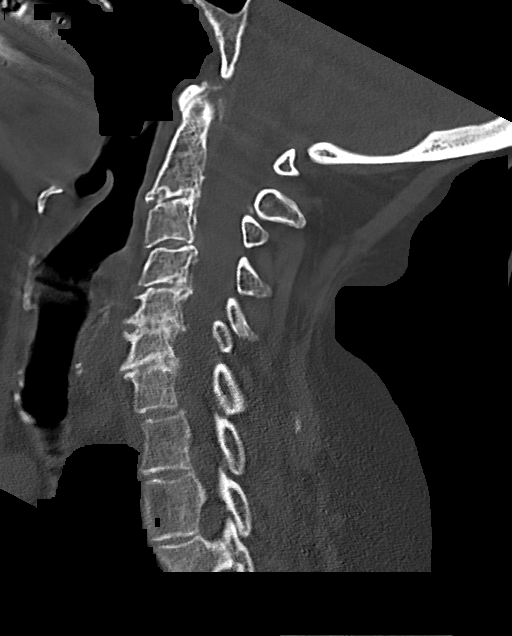
[im 31/61  soft-tissue]
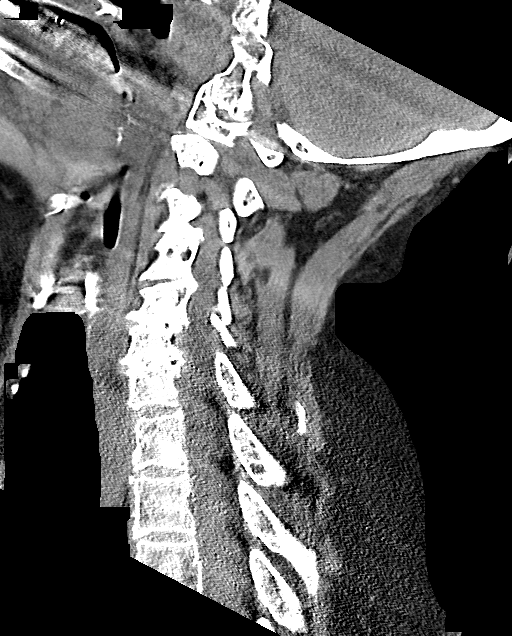
[im 31/61  bone]
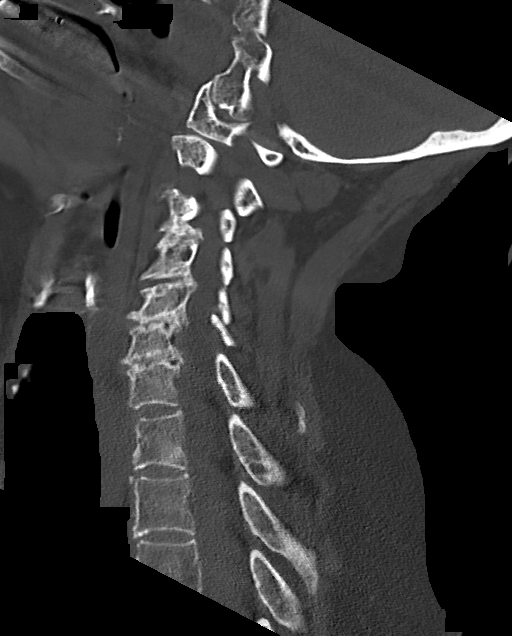
[im 36/61  bone]
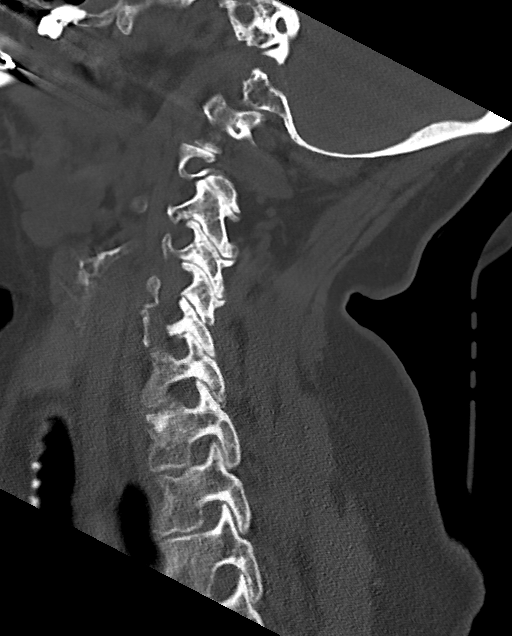
[im 41/61  bone]
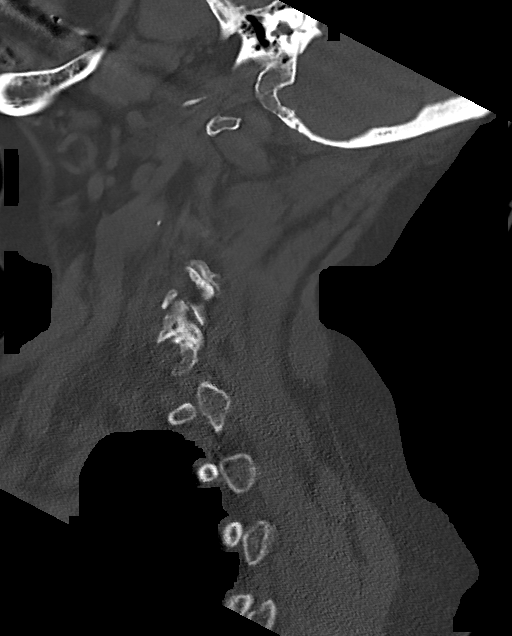

[Series 7: c_spine 2.0 cor bone · coronal · 0.25mm/px · 3 of 69 slices shown]
[im 18/69  bone]
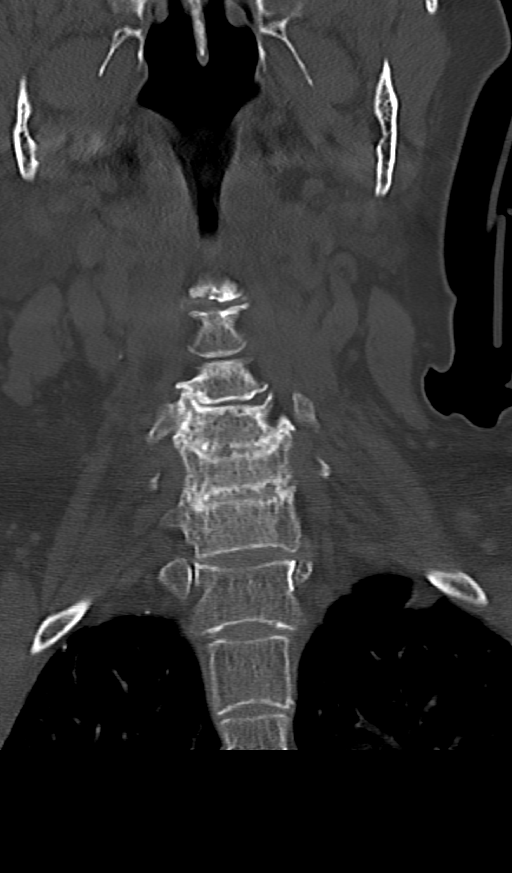
[im 29/69  bone]
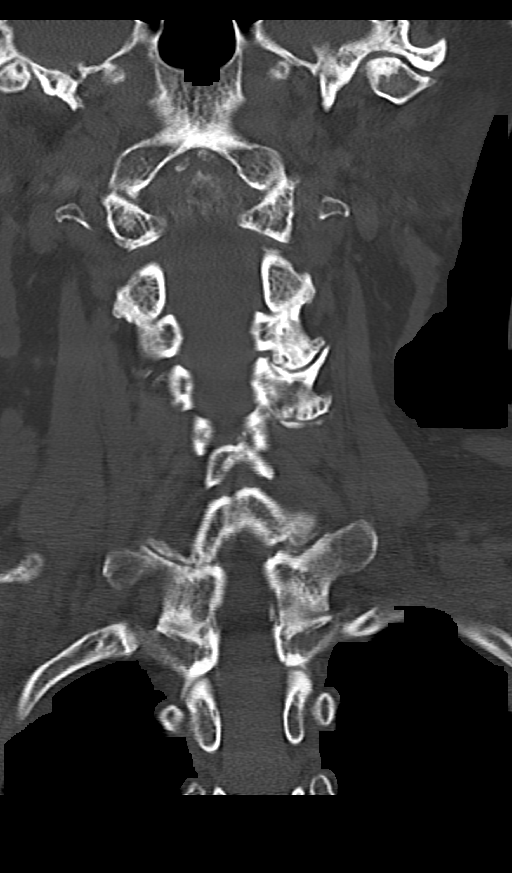
[im 40/69  bone]
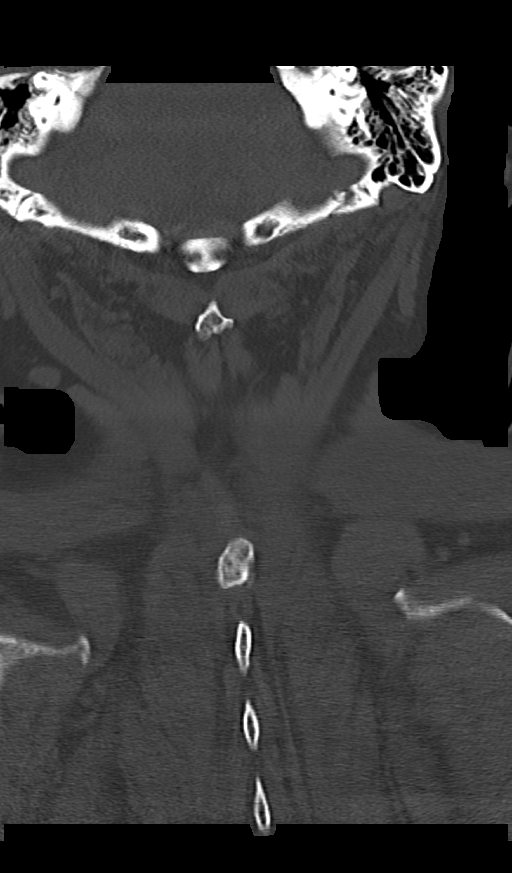

[13 of 33 positions shown; findings below may reference images not displayed]

FINDINGS: Alignment: No subluxation.

Skull base and vertebrae: No acute fracture. No primary bone lesion
or focal pathologic process.

Soft tissues and spinal canal: No prevertebral fluid or swelling. No
visible canal hematoma.

Disc levels: Advanced diffuse degenerative disc disease and facet
disease.

Upper chest: No acute findings

Other: None
IMPRESSION: Advanced diffuse degenerative disc and facet disease.

No acute bony abnormality.

## 2023-01-02 IMAGING — CT CT HEAD W/O CM
3 series · 16 of 37 positions shown, 18 images · non-contrast
Comparison: None.

CLINICAL DATA: Head trauma, moderate-severe fall



[Series 3: head without · axial · non-contrast · 0.39mm/px · z∈[-114,+6]mm · 7 of 32 slices shown, 9 images]
[im 4/32  brain]
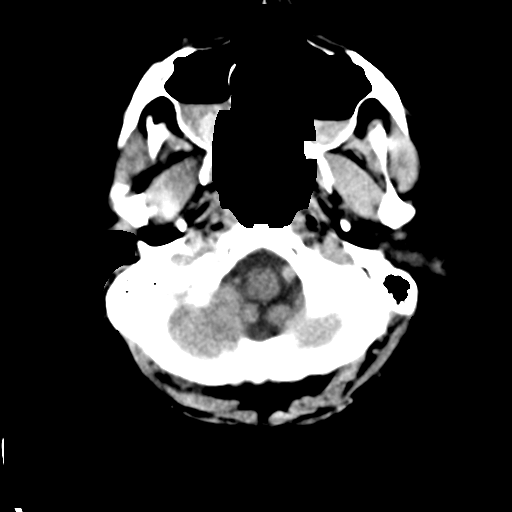
[im 4/32  bone]
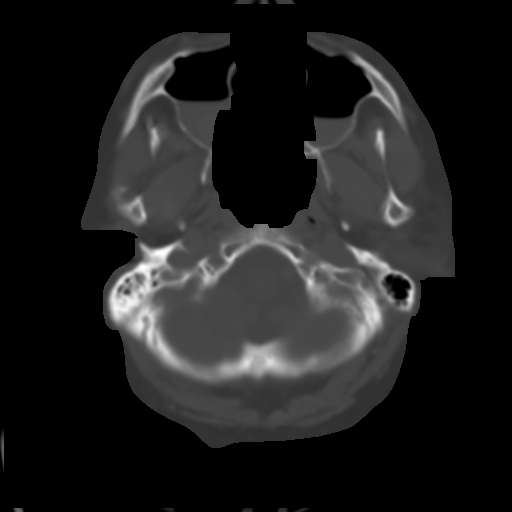
[im 8/32  brain]
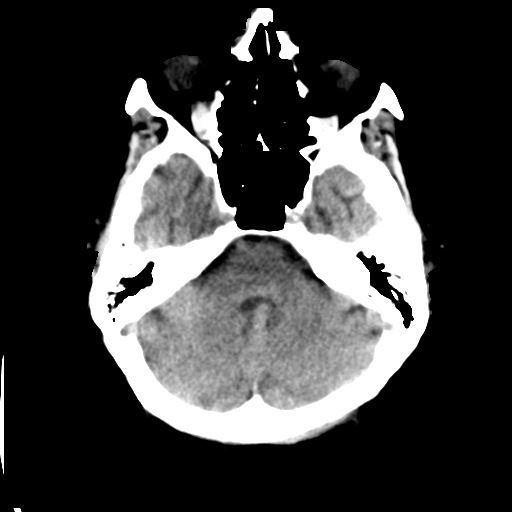
[im 12/32  brain]
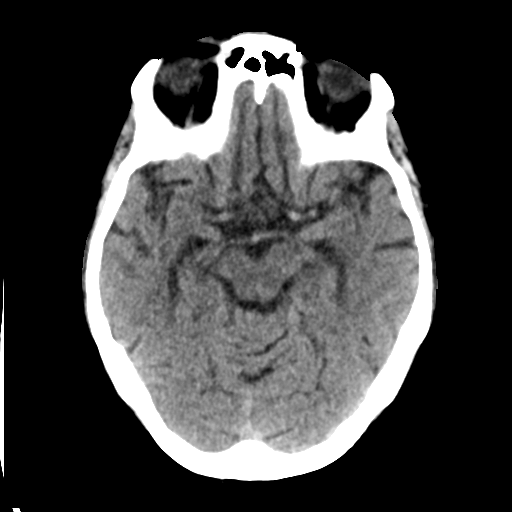
[im 16/32  brain]
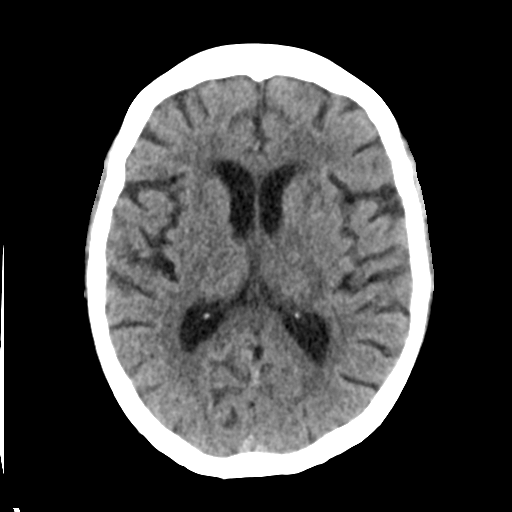
[im 20/32  brain]
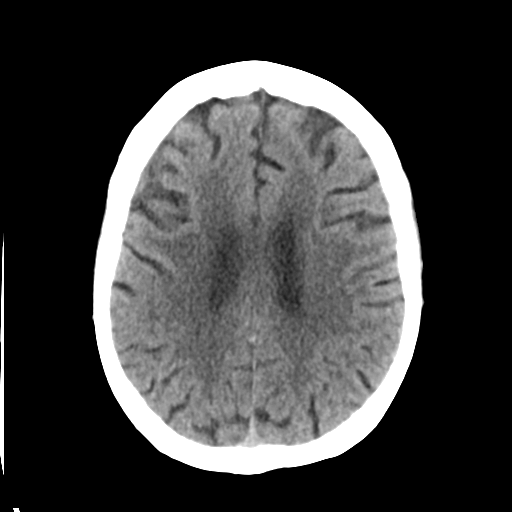
[im 20/32  bone]
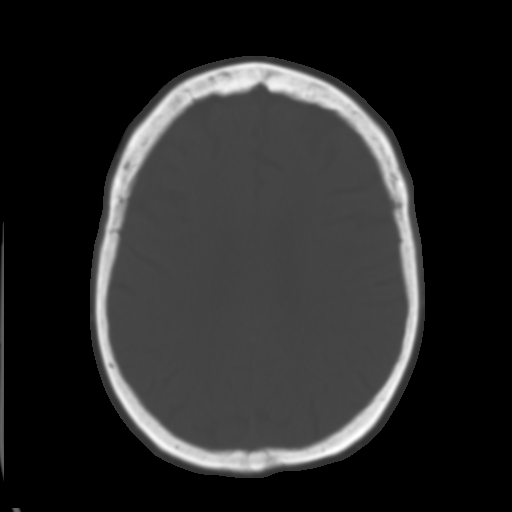
[im 24/32  brain]
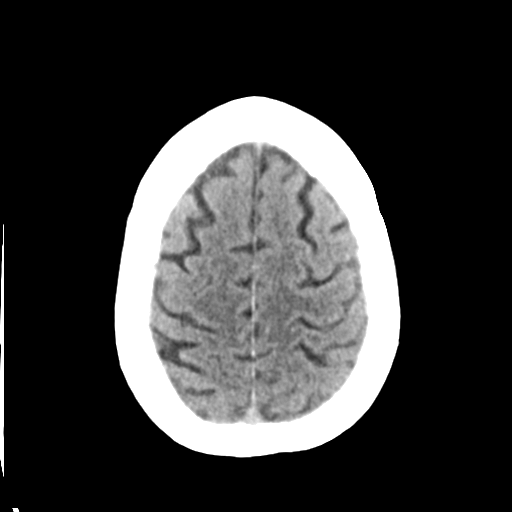
[im 28/32  brain]
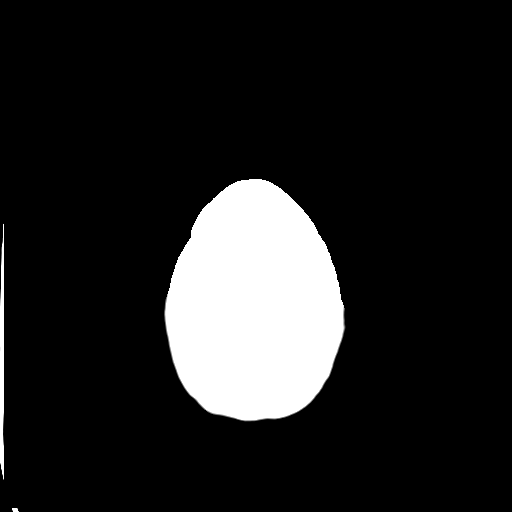

[Series 4: head bone · axial · 0.39mm/px · z∈[-115,-21]mm · 6 of 79 slices shown]
[im 8/79  bone]
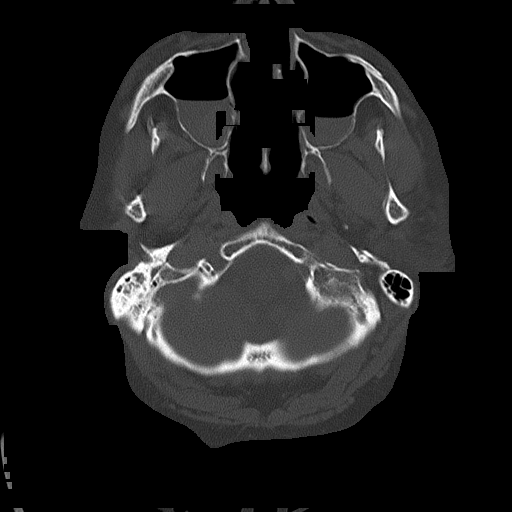
[im 16/79  bone]
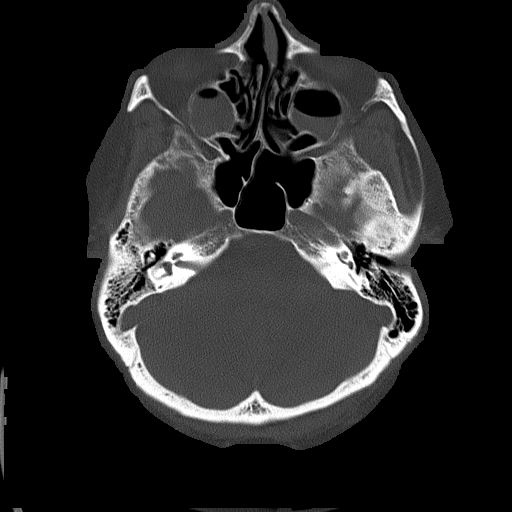
[im 24/79  bone]
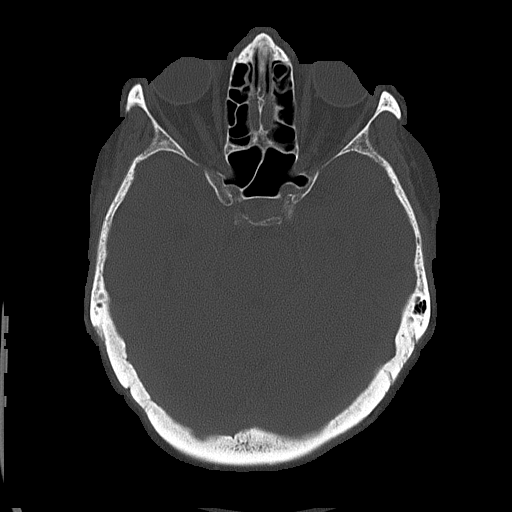
[im 36/79  bone]
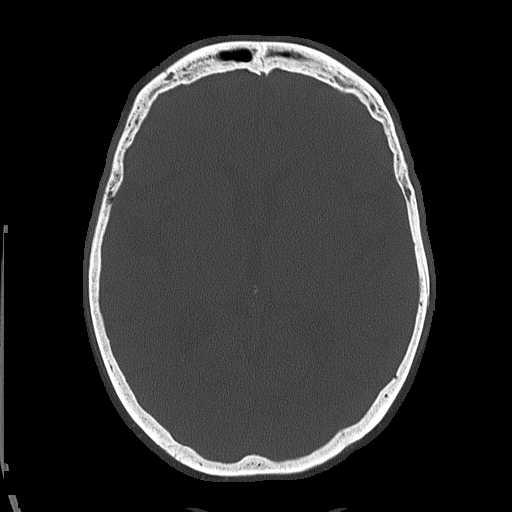
[im 43/79  bone]
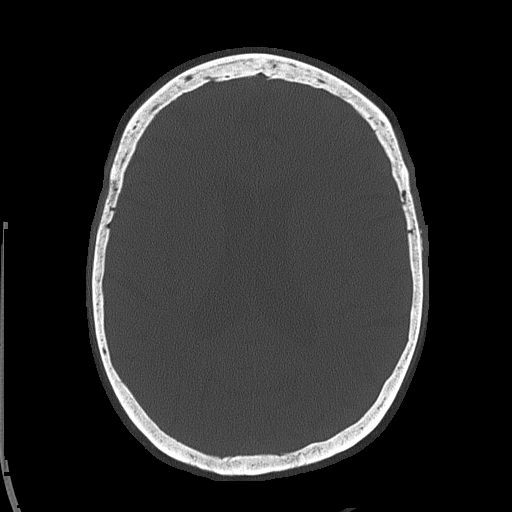
[im 55/79  bone]
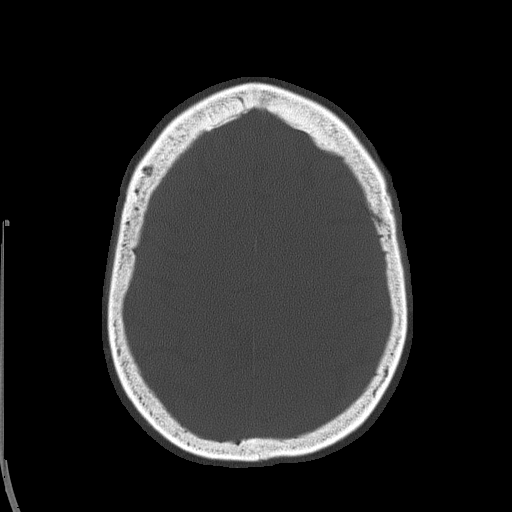

[Series 6: head without sag · sagittal · non-contrast · 0.28mm/px · 3 of 67 slices shown]
[im 23/67  brain]
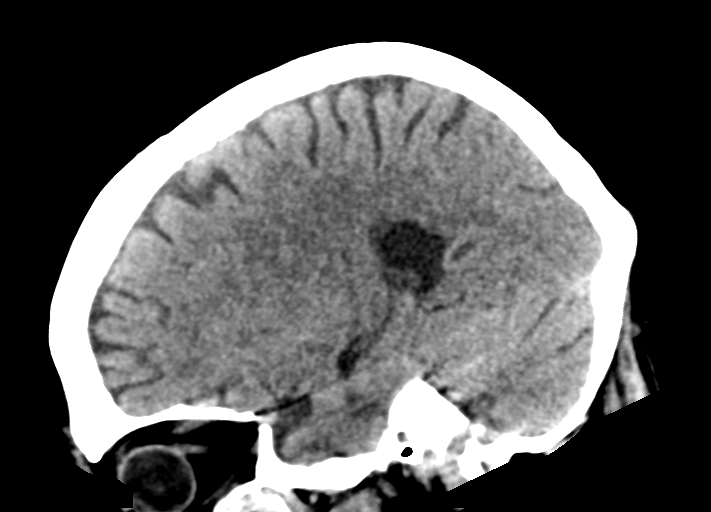
[im 34/67  brain]
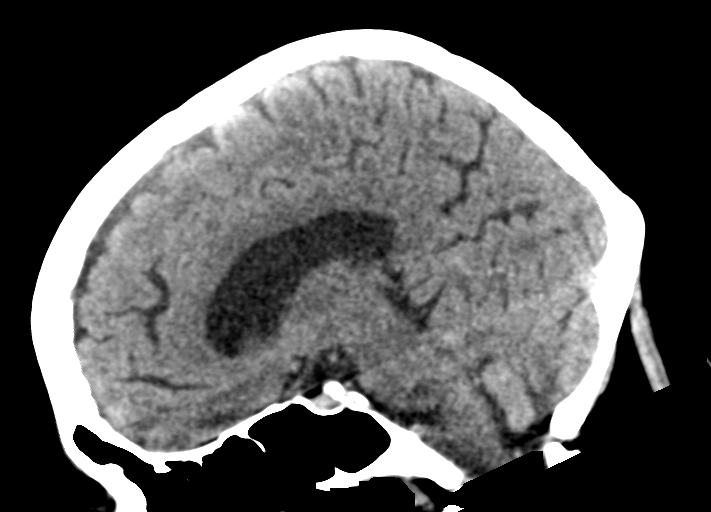
[im 45/67  brain]
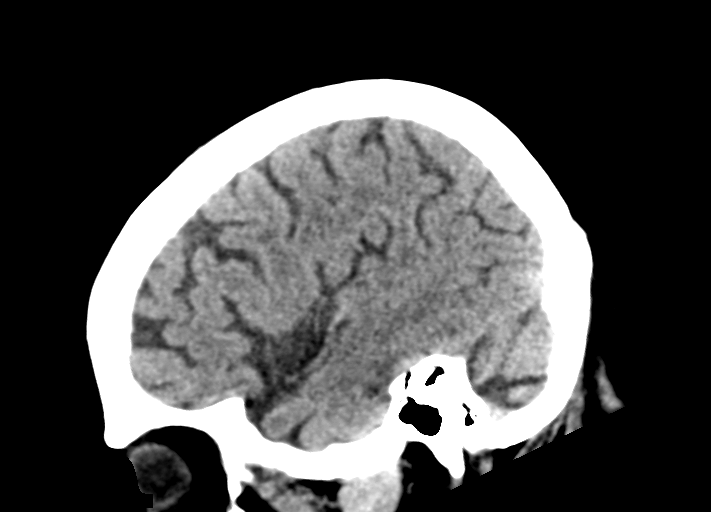

[16 of 37 positions shown; findings below may reference images not displayed]

FINDINGS: Brain: No acute intracranial abnormality. Specifically, no
hemorrhage, hydrocephalus, mass lesion, acute infarction, or
significant intracranial injury.

Vascular: No hyperdense vessel or unexpected calcification.

Skull: No acute calvarial abnormality.

Sinuses/Orbits: Air-fluid levels in the maxillary sinuses.

Other: None
IMPRESSION: No acute intracranial abnormality.

Bilateral acute maxillary sinusitis.

## 2023-01-05 DIAGNOSIS — M810 Age-related osteoporosis without current pathological fracture: Secondary | ICD-10-CM | POA: Diagnosis not present

## 2023-03-08 DIAGNOSIS — M25552 Pain in left hip: Secondary | ICD-10-CM | POA: Diagnosis not present

## 2023-03-08 DIAGNOSIS — M8589 Other specified disorders of bone density and structure, multiple sites: Secondary | ICD-10-CM | POA: Diagnosis not present

## 2023-03-08 DIAGNOSIS — M25559 Pain in unspecified hip: Secondary | ICD-10-CM | POA: Diagnosis not present

## 2023-03-08 DIAGNOSIS — M25551 Pain in right hip: Secondary | ICD-10-CM | POA: Diagnosis not present

## 2023-03-08 DIAGNOSIS — M150 Primary generalized (osteo)arthritis: Secondary | ICD-10-CM | POA: Diagnosis not present

## 2023-03-08 DIAGNOSIS — Z7952 Long term (current) use of systemic steroids: Secondary | ICD-10-CM | POA: Diagnosis not present

## 2023-03-08 DIAGNOSIS — M25511 Pain in right shoulder: Secondary | ICD-10-CM | POA: Diagnosis not present

## 2023-03-08 DIAGNOSIS — M353 Polymyalgia rheumatica: Secondary | ICD-10-CM | POA: Diagnosis not present

## 2023-03-11 DIAGNOSIS — M810 Age-related osteoporosis without current pathological fracture: Secondary | ICD-10-CM | POA: Diagnosis not present

## 2023-03-11 DIAGNOSIS — R54 Age-related physical debility: Secondary | ICD-10-CM | POA: Diagnosis not present

## 2023-03-11 DIAGNOSIS — Z0000 Encounter for general adult medical examination without abnormal findings: Secondary | ICD-10-CM | POA: Diagnosis not present

## 2023-03-11 DIAGNOSIS — Z131 Encounter for screening for diabetes mellitus: Secondary | ICD-10-CM | POA: Diagnosis not present

## 2023-03-11 DIAGNOSIS — M353 Polymyalgia rheumatica: Secondary | ICD-10-CM | POA: Diagnosis not present

## 2023-03-11 DIAGNOSIS — E785 Hyperlipidemia, unspecified: Secondary | ICD-10-CM | POA: Diagnosis not present

## 2023-03-11 DIAGNOSIS — R2681 Unsteadiness on feet: Secondary | ICD-10-CM | POA: Diagnosis not present

## 2023-03-11 DIAGNOSIS — I10 Essential (primary) hypertension: Secondary | ICD-10-CM | POA: Diagnosis not present

## 2023-03-11 DIAGNOSIS — E119 Type 2 diabetes mellitus without complications: Secondary | ICD-10-CM | POA: Diagnosis not present

## 2023-07-16 DIAGNOSIS — I10 Essential (primary) hypertension: Secondary | ICD-10-CM | POA: Diagnosis not present

## 2023-07-16 DIAGNOSIS — M810 Age-related osteoporosis without current pathological fracture: Secondary | ICD-10-CM | POA: Diagnosis not present

## 2023-07-16 DIAGNOSIS — Z0000 Encounter for general adult medical examination without abnormal findings: Secondary | ICD-10-CM | POA: Diagnosis not present

## 2023-07-16 DIAGNOSIS — L989 Disorder of the skin and subcutaneous tissue, unspecified: Secondary | ICD-10-CM | POA: Diagnosis not present

## 2023-07-16 DIAGNOSIS — Z23 Encounter for immunization: Secondary | ICD-10-CM | POA: Diagnosis not present

## 2023-07-16 DIAGNOSIS — R54 Age-related physical debility: Secondary | ICD-10-CM | POA: Diagnosis not present

## 2023-07-16 DIAGNOSIS — R413 Other amnesia: Secondary | ICD-10-CM | POA: Diagnosis not present

## 2023-07-16 DIAGNOSIS — E1169 Type 2 diabetes mellitus with other specified complication: Secondary | ICD-10-CM | POA: Diagnosis not present

## 2023-07-16 DIAGNOSIS — M353 Polymyalgia rheumatica: Secondary | ICD-10-CM | POA: Diagnosis not present

## 2023-07-16 DIAGNOSIS — K219 Gastro-esophageal reflux disease without esophagitis: Secondary | ICD-10-CM | POA: Diagnosis not present

## 2023-07-16 DIAGNOSIS — E785 Hyperlipidemia, unspecified: Secondary | ICD-10-CM | POA: Diagnosis not present

## 2023-09-20 DIAGNOSIS — M810 Age-related osteoporosis without current pathological fracture: Secondary | ICD-10-CM | POA: Diagnosis not present

## 2023-09-20 DIAGNOSIS — M25559 Pain in unspecified hip: Secondary | ICD-10-CM | POA: Diagnosis not present

## 2023-09-20 DIAGNOSIS — M150 Primary generalized (osteo)arthritis: Secondary | ICD-10-CM | POA: Diagnosis not present

## 2023-09-20 DIAGNOSIS — M353 Polymyalgia rheumatica: Secondary | ICD-10-CM | POA: Diagnosis not present

## 2023-09-20 DIAGNOSIS — Z7952 Long term (current) use of systemic steroids: Secondary | ICD-10-CM | POA: Diagnosis not present

## 2023-09-20 DIAGNOSIS — R2689 Other abnormalities of gait and mobility: Secondary | ICD-10-CM | POA: Diagnosis not present

## 2023-12-29 DIAGNOSIS — R2689 Other abnormalities of gait and mobility: Secondary | ICD-10-CM | POA: Diagnosis not present

## 2023-12-29 DIAGNOSIS — M8589 Other specified disorders of bone density and structure, multiple sites: Secondary | ICD-10-CM | POA: Diagnosis not present

## 2023-12-29 DIAGNOSIS — Z7952 Long term (current) use of systemic steroids: Secondary | ICD-10-CM | POA: Diagnosis not present

## 2023-12-29 DIAGNOSIS — M25511 Pain in right shoulder: Secondary | ICD-10-CM | POA: Diagnosis not present

## 2023-12-29 DIAGNOSIS — M25512 Pain in left shoulder: Secondary | ICD-10-CM | POA: Diagnosis not present

## 2023-12-29 DIAGNOSIS — M150 Primary generalized (osteo)arthritis: Secondary | ICD-10-CM | POA: Diagnosis not present

## 2023-12-29 DIAGNOSIS — M353 Polymyalgia rheumatica: Secondary | ICD-10-CM | POA: Diagnosis not present

## 2023-12-31 DIAGNOSIS — E1169 Type 2 diabetes mellitus with other specified complication: Secondary | ICD-10-CM | POA: Diagnosis not present

## 2023-12-31 DIAGNOSIS — J069 Acute upper respiratory infection, unspecified: Secondary | ICD-10-CM | POA: Diagnosis not present

## 2023-12-31 DIAGNOSIS — M810 Age-related osteoporosis without current pathological fracture: Secondary | ICD-10-CM | POA: Diagnosis not present

## 2023-12-31 DIAGNOSIS — I10 Essential (primary) hypertension: Secondary | ICD-10-CM | POA: Diagnosis not present

## 2024-01-12 DIAGNOSIS — Z7952 Long term (current) use of systemic steroids: Secondary | ICD-10-CM | POA: Diagnosis not present

## 2024-01-12 DIAGNOSIS — R2689 Other abnormalities of gait and mobility: Secondary | ICD-10-CM | POA: Diagnosis not present

## 2024-01-12 DIAGNOSIS — M353 Polymyalgia rheumatica: Secondary | ICD-10-CM | POA: Diagnosis not present

## 2024-01-12 DIAGNOSIS — M25511 Pain in right shoulder: Secondary | ICD-10-CM | POA: Diagnosis not present

## 2024-01-12 DIAGNOSIS — M8589 Other specified disorders of bone density and structure, multiple sites: Secondary | ICD-10-CM | POA: Diagnosis not present

## 2024-01-12 DIAGNOSIS — M150 Primary generalized (osteo)arthritis: Secondary | ICD-10-CM | POA: Diagnosis not present

## 2024-01-28 DIAGNOSIS — L249 Irritant contact dermatitis, unspecified cause: Secondary | ICD-10-CM | POA: Diagnosis not present

## 2024-03-01 DIAGNOSIS — D044 Carcinoma in situ of skin of scalp and neck: Secondary | ICD-10-CM | POA: Diagnosis not present

## 2024-03-01 DIAGNOSIS — C44319 Basal cell carcinoma of skin of other parts of face: Secondary | ICD-10-CM | POA: Diagnosis not present

## 2024-03-01 DIAGNOSIS — D485 Neoplasm of uncertain behavior of skin: Secondary | ICD-10-CM | POA: Diagnosis not present

## 2024-03-01 DIAGNOSIS — C4401 Basal cell carcinoma of skin of lip: Secondary | ICD-10-CM | POA: Diagnosis not present

## 2024-03-01 DIAGNOSIS — L249 Irritant contact dermatitis, unspecified cause: Secondary | ICD-10-CM | POA: Diagnosis not present

## 2024-03-15 DIAGNOSIS — M1990 Unspecified osteoarthritis, unspecified site: Secondary | ICD-10-CM | POA: Diagnosis not present

## 2024-03-15 DIAGNOSIS — Z0000 Encounter for general adult medical examination without abnormal findings: Secondary | ICD-10-CM | POA: Diagnosis not present

## 2024-03-15 DIAGNOSIS — I10 Essential (primary) hypertension: Secondary | ICD-10-CM | POA: Diagnosis not present

## 2024-03-15 DIAGNOSIS — E1169 Type 2 diabetes mellitus with other specified complication: Secondary | ICD-10-CM | POA: Diagnosis not present

## 2024-03-15 DIAGNOSIS — R262 Difficulty in walking, not elsewhere classified: Secondary | ICD-10-CM | POA: Diagnosis not present

## 2024-03-15 DIAGNOSIS — R54 Age-related physical debility: Secondary | ICD-10-CM | POA: Diagnosis not present

## 2024-03-15 DIAGNOSIS — E785 Hyperlipidemia, unspecified: Secondary | ICD-10-CM | POA: Diagnosis not present

## 2024-03-15 DIAGNOSIS — M810 Age-related osteoporosis without current pathological fracture: Secondary | ICD-10-CM | POA: Diagnosis not present

## 2024-03-15 DIAGNOSIS — Z9181 History of falling: Secondary | ICD-10-CM | POA: Diagnosis not present

## 2024-03-15 DIAGNOSIS — M353 Polymyalgia rheumatica: Secondary | ICD-10-CM | POA: Diagnosis not present

## 2024-05-23 DIAGNOSIS — C44319 Basal cell carcinoma of skin of other parts of face: Secondary | ICD-10-CM | POA: Diagnosis not present

## 2024-06-06 DIAGNOSIS — C4401 Basal cell carcinoma of skin of lip: Secondary | ICD-10-CM | POA: Diagnosis not present

## 2024-07-04 DIAGNOSIS — E1169 Type 2 diabetes mellitus with other specified complication: Secondary | ICD-10-CM | POA: Diagnosis not present

## 2024-07-04 DIAGNOSIS — I10 Essential (primary) hypertension: Secondary | ICD-10-CM | POA: Diagnosis not present

## 2024-07-04 DIAGNOSIS — Z23 Encounter for immunization: Secondary | ICD-10-CM | POA: Diagnosis not present

## 2024-07-04 DIAGNOSIS — M353 Polymyalgia rheumatica: Secondary | ICD-10-CM | POA: Diagnosis not present

## 2024-07-04 DIAGNOSIS — I69319 Unspecified symptoms and signs involving cognitive functions following cerebral infarction: Secondary | ICD-10-CM | POA: Diagnosis not present

## 2024-07-05 DIAGNOSIS — M150 Primary generalized (osteo)arthritis: Secondary | ICD-10-CM | POA: Diagnosis not present

## 2024-07-05 DIAGNOSIS — Z7952 Long term (current) use of systemic steroids: Secondary | ICD-10-CM | POA: Diagnosis not present

## 2024-07-05 DIAGNOSIS — M353 Polymyalgia rheumatica: Secondary | ICD-10-CM | POA: Diagnosis not present

## 2024-07-05 DIAGNOSIS — M810 Age-related osteoporosis without current pathological fracture: Secondary | ICD-10-CM | POA: Diagnosis not present

## 2024-07-25 DIAGNOSIS — M810 Age-related osteoporosis without current pathological fracture: Secondary | ICD-10-CM | POA: Diagnosis not present
